# Patient Record
Sex: Male | Born: 1935
Health system: Southern US, Community
[De-identification: ages and names within clinical notes are randomized; demographics above are authoritative.]

## PROBLEM LIST (undated history)

## (undated) DIAGNOSIS — E785 Hyperlipidemia, unspecified: Secondary | ICD-10-CM

## (undated) DIAGNOSIS — K921 Melena: Secondary | ICD-10-CM

## (undated) DIAGNOSIS — G459 Transient cerebral ischemic attack, unspecified: Secondary | ICD-10-CM

## (undated) DIAGNOSIS — J302 Other seasonal allergic rhinitis: Secondary | ICD-10-CM

## (undated) DIAGNOSIS — N400 Enlarged prostate without lower urinary tract symptoms: Secondary | ICD-10-CM

## (undated) DIAGNOSIS — R32 Unspecified urinary incontinence: Secondary | ICD-10-CM

## (undated) HISTORY — DX: Benign prostatic hyperplasia without lower urinary tract symptoms: N40.0

## (undated) HISTORY — DX: Unspecified urinary incontinence: R32

## (undated) HISTORY — DX: Melena: K92.1

## (undated) HISTORY — DX: Transient cerebral ischemic attack, unspecified: G45.9

## (undated) HISTORY — DX: Hyperlipidemia, unspecified: E78.5

## (undated) HISTORY — DX: Other seasonal allergic rhinitis: J30.2

---

## 1962-08-07 HISTORY — PX: TONSILLECTOMY: SUR1361

## 2008-08-07 HISTORY — PX: BACK SURGERY: SHX140

## 2014-08-07 LAB — HM COLONOSCOPY

## 2016-08-07 HISTORY — PX: NECK SURGERY: SHX720

## 2017-08-14 ENCOUNTER — Ambulatory Visit: Payer: Medicare Other | Admitting: Primary Care

## 2017-08-14 ENCOUNTER — Encounter: Payer: Self-pay | Admitting: Primary Care

## 2017-08-14 ENCOUNTER — Other Ambulatory Visit: Payer: Self-pay | Admitting: Primary Care

## 2017-08-14 VITALS — BP 124/82 | HR 62 | Temp 97.8°F | Ht 71.5 in | Wt 195.0 lb

## 2017-08-14 DIAGNOSIS — G459 Transient cerebral ischemic attack, unspecified: Secondary | ICD-10-CM

## 2017-08-14 DIAGNOSIS — N400 Enlarged prostate without lower urinary tract symptoms: Secondary | ICD-10-CM

## 2017-08-14 DIAGNOSIS — E785 Hyperlipidemia, unspecified: Secondary | ICD-10-CM | POA: Insufficient documentation

## 2017-08-14 DIAGNOSIS — H409 Unspecified glaucoma: Secondary | ICD-10-CM | POA: Diagnosis not present

## 2017-08-14 LAB — COMPREHENSIVE METABOLIC PANEL
ALBUMIN: 4.2 g/dL (ref 3.5–5.2)
ALK PHOS: 36 U/L — AB (ref 39–117)
ALT: 20 U/L (ref 0–53)
AST: 20 U/L (ref 0–37)
BUN: 22 mg/dL (ref 6–23)
CALCIUM: 9.2 mg/dL (ref 8.4–10.5)
CO2: 30 mEq/L (ref 19–32)
CREATININE: 0.98 mg/dL (ref 0.40–1.50)
Chloride: 104 mEq/L (ref 96–112)
GFR: 78.28 mL/min (ref >60.00)
Glucose, Bld: 100 mg/dL — ABNORMAL HIGH (ref 70–99)
Potassium: 4.8 mEq/L (ref 3.5–5.1)
Sodium: 138 mEq/L (ref 135–145)
Total Bilirubin: 0.6 mg/dL (ref 0.2–1.2)
Total Protein: 6.5 g/dL (ref 6.0–8.3)

## 2017-08-14 LAB — LIPID PANEL
CHOLESTEROL: 142 mg/dL (ref 0–200)
HDL: 36.2 mg/dL — ABNORMAL LOW (ref >39.00)
LDL Cholesterol: 78 mg/dL (ref 0–99)
NonHDL: 105.3
TRIGLYCERIDES: 136 mg/dL (ref 0.0–149.0)
Total CHOL/HDL Ratio: 4
VLDL: 27.2 mg/dL (ref 0.0–40.0)

## 2017-08-14 MED ORDER — SIMVASTATIN 10 MG PO TABS: 10.0000 mg | ORAL_TABLET | Freq: Every day | ORAL | 3 refills | Status: DC

## 2017-08-14 NOTE — Assessment & Plan Note (Signed)
Diagnosed in 2000, has been on Plavix since.  Continue Plavix and lipid control.  Lipid panel pending today.

## 2017-08-14 NOTE — Assessment & Plan Note (Signed)
Repeat lipid panel pending today.  Continue simvastatin.

## 2017-08-14 NOTE — Assessment & Plan Note (Signed)
Asymptomatic with tamsulosin.  Continue same.

## 2017-08-14 NOTE — Patient Instructions (Signed)
Stop by the lab prior to leaving today. I will notify you of your results once received.   Please schedule a physical with me in six months. You may also schedule a lab only appointment 3-4 days prior. We will discuss your lab results in detail during your physical.  It was a pleasure to meet you today! Please don't hesitate to call or message me with any questions. Welcome to [facility]!

## 2017-08-14 NOTE — Progress Notes (Signed)
Subjective:    Patient ID: Julian Ellis, male    DOB: Jul 27, 1936, 83 y.o.   MRN: [id number]  HPI  Julian Ellis is a 82 year old male who presents today to establish care and discuss the problems mentioned below. Will obtain old records. His last physical was about 1 year ago.  1) Hyperlipidemia: Currently managed on Simvastatin 10 mg. His last cholesterol check was about one year ago which was normal.  He is fasting today.  2) BPH: Currently managed on tamsulosin 0.4 mg for which he's taken for years. This has helped with symptoms of nocturia and daily frequency.   3) Glaucoma: Currently managed on timolol and latanoprost drops. He is following through the [place] and thinks he'll be following with [facility].   4) TIA: Diagnosed in 2000. Symptoms of dizziness that was presumed to be central vertigo secondary to TIA. He's been on Plavix since.  He denies dizziness, numbness/tingling, weakness.  5) Chronic Neck and Back Pain: Cervical Spinal fusion in 2018. History of microdiscectomy to lumbar spine in 2010 from ruptured disc.  He feels that he has recovered well from the cervical fusion and reports significant increase of range of motion.  Review of Systems  Eyes: Negative for visual disturbance.  Respiratory: Negative for shortness of breath.   Cardiovascular: Negative for chest pain.  Musculoskeletal: Negative for back pain and neck pain.  Neurological: Negative for dizziness, weakness, numbness and headaches.       Past Medical History:  Diagnosis Date  . Allergy   . Blood in stool   . BPH (benign prostatic hyperplasia)   . Hyperlipidemia   . Stroke ([place])   . Urine incontinence      Social History   Socioeconomic History  . Marital status: Married    Spouse name: Not on file  . Number of children: Not on file  . Years of education: Not on file  . Highest education level: Not on file  Social Needs  . Financial resource strain: Not on file  . Food insecurity - worry:  Not on file  . Food insecurity - inability: Not on file  . Transportation needs - medical: Not on file  . Transportation needs - non-medical: Not on file  Occupational History  . Not on file  Tobacco Use  . Smoking status: Never Smoker  . Smokeless tobacco: Never Used  Substance and Sexual Activity  . Alcohol use: No    Frequency: Never  . Drug use: No  . Sexual activity: Yes  Other Topics Concern  . Not on file  Social History Narrative   Married.   3 children.   Moved from [place].   Worked for [employer].   Enjoys hiking, spending time outdoors.       Family History  Problem Relation Age of Onset  . Hyperlipidemia Mother   . Hypertension Mother   . Heart disease Father   . Heart attack Father   . Heart attack Sister   . Stroke Sister   . Alcohol abuse Brother   . Stroke Brother   . Early death Sister   . Heart disease Brother   . Heart attack Brother     Allergies  Allergen Reactions  . Compazine [Prochlorperazine] Anaphylaxis  . Gluten Meal   . Wheat Bran     Current Outpatient Medications on File Prior to Visit  Medication Sig Dispense Refill  . clopidogrel (PLAVIX) 75 MG tablet Take 75 mg by mouth daily.    Marland Kitchen  latanoprost (XALATAN) 0.005 % ophthalmic solution Place 1 drop into both eyes at bedtime.    . Multiple Vitamin (MULTIVITAMIN) tablet Take 1 tablet by mouth daily.    . simvastatin (ZOCOR) 10 MG tablet Take 10 mg by mouth daily.    . tamsulosin (FLOMAX) 0.4 MG CAPS capsule Take 0.4 mg by mouth daily.    . timolol (BETIMOL) 0.5 % ophthalmic solution Place 1 drop into both eyes daily.     No current facility-administered medications on file prior to visit.     BP 124/82   Pulse 62   Temp 97.8 F (36.6 C) (Oral)   Ht 5' 11.5" (1.816 m)   Wt 195 lb (88.5 kg)   SpO2 98%   BMI 26.82 kg/m    Objective:   Physical Exam  Constitutional: He is oriented to person, place, and time. He appears well-nourished.  Neck: Neck supple.    Cardiovascular: Normal rate and regular rhythm.  Pulmonary/Chest: Effort normal and breath sounds normal. He has no wheezes. He has no rales.  Neurological: He is alert and oriented to person, place, and time.  Skin: Skin is warm and dry.  Psychiatric: He has a normal mood and affect.          Assessment & Plan:

## 2017-08-14 NOTE — Assessment & Plan Note (Signed)
He will be following with the [facility] probably in [place] for maintenance.

## 2017-10-10 ENCOUNTER — Other Ambulatory Visit: Payer: Self-pay | Admitting: Primary Care

## 2017-10-10 DIAGNOSIS — N400 Enlarged prostate without lower urinary tract symptoms: Secondary | ICD-10-CM

## 2017-10-10 DIAGNOSIS — G459 Transient cerebral ischemic attack, unspecified: Secondary | ICD-10-CM

## 2017-10-10 MED ORDER — CLOPIDOGREL BISULFATE 75 MG PO TABS: 75.0000 mg | ORAL_TABLET | Freq: Every day | ORAL | 3 refills | Status: DC

## 2017-10-10 MED ORDER — TAMSULOSIN HCL 0.4 MG PO CAPS: 0.4000 mg | ORAL_CAPSULE | Freq: Every day | ORAL | 3 refills | Status: DC

## 2017-10-10 NOTE — Telephone Encounter (Signed)
Received faxed refill request   Medications have not been prescribed by Julian Ellis. Patient was first seen on 08/14/2017

## 2017-10-10 NOTE — Telephone Encounter (Signed)
Refills sent to pharmacy. 

## 2018-04-17 ENCOUNTER — Other Ambulatory Visit: Payer: Self-pay | Admitting: Primary Care

## 2018-04-17 DIAGNOSIS — E785 Hyperlipidemia, unspecified: Secondary | ICD-10-CM

## 2018-04-26 ENCOUNTER — Ambulatory Visit (INDEPENDENT_AMBULATORY_CARE_PROVIDER_SITE_OTHER): Payer: Medicare Other | Admitting: Primary Care

## 2018-04-26 ENCOUNTER — Encounter: Payer: Self-pay | Admitting: Primary Care

## 2018-04-26 VITALS — BP 120/76 | HR 59 | Temp 98.2°F | Ht 71.5 in | Wt 188.8 lb

## 2018-04-26 DIAGNOSIS — G459 Transient cerebral ischemic attack, unspecified: Secondary | ICD-10-CM | POA: Diagnosis not present

## 2018-04-26 DIAGNOSIS — N400 Enlarged prostate without lower urinary tract symptoms: Secondary | ICD-10-CM | POA: Diagnosis not present

## 2018-04-26 DIAGNOSIS — Z23 Encounter for immunization: Secondary | ICD-10-CM | POA: Diagnosis not present

## 2018-04-26 DIAGNOSIS — L989 Disorder of the skin and subcutaneous tissue, unspecified: Secondary | ICD-10-CM

## 2018-04-26 MED ORDER — CLOPIDOGREL BISULFATE 75 MG PO TABS: 75.0000 mg | ORAL_TABLET | Freq: Every day | ORAL | 1 refills | Status: DC

## 2018-04-26 MED ORDER — TAMSULOSIN HCL 0.4 MG PO CAPS
0.4000 mg | ORAL_CAPSULE | Freq: Every day | ORAL | 3 refills | Status: DC
Start: 2018-04-26 — End: 2019-01-13

## 2018-04-26 NOTE — Assessment & Plan Note (Signed)
Compliant to clopidogrel for years. He will speak with the [facility] regarding continuing this medication. Refills sent to pharmacy.

## 2018-04-26 NOTE — Progress Notes (Signed)
Subjective:    Patient ID: Julian Ellis, male    DOB: 1936-04-05, 82 y.o.   MRN: [id number]  HPI  Julian Ellis is an 82 year old male who presents today for form completion, medication refills, and a chief complaint of skin irritation.  1) Form Completion: He presents today with a form form [place]. The form is to make sure he is able to undergo a 2-3 mile hike over natural surface trail including roots, rocks, inclines/declines.   He used to lead hikes professionally and feels he's able to complete this hike without problems. His last hike was 4 miles last Friday. He denies exertional shortness of breath and chest pain.   2) Skin Spots: Located to bilateral face and cranium. The spots are located to the bilateral cheeks and parietal head that he first noticed about one month. The spots will get bright red and will then be flesh colored. He's noticed some scaling. He denies pain, itching. He is outdoors often and wears a hat. He does not use sunscreen.   3) Medication Refills: Requesting refills of tamsulosin 0.4 mg and clopidogrel 75 mg. He is doing well on both medications for which he takes daily. He would like to consider coming off of clopidogrel as he's been taking for over 10 years. He will discuss this during his visit at the [facility] in October 2019.  Review of Systems  Constitutional: Negative for fatigue.  Respiratory: Negative for shortness of breath.   Cardiovascular: Negative for chest pain.  Skin:       Skin lesions  Neurological: Negative for dizziness and headaches.       Past Medical History:  Diagnosis Date  . Blood in stool   . BPH (benign prostatic hyperplasia)   . Hyperlipidemia   . Seasonal allergies   . TIA (transient ischemic attack)   . Urine incontinence      Social History   Socioeconomic History  . Marital status: Married    Spouse name: Not on file  . Number of children: Not on file  . Years of education: Not on file  .  Highest education level: Not on file  Occupational History  . Not on file  Social Needs  . Financial resource strain: Not on file  . Food insecurity:    Worry: Not on file    Inability: Not on file  . Transportation needs:    Medical: Not on file    Non-medical: Not on file  Tobacco Use  . Smoking status: Never Smoker  . Smokeless tobacco: Never Used  Substance and Sexual Activity  . Alcohol use: No    Frequency: Never  . Drug use: No  . Sexual activity: Yes  Lifestyle  . Physical activity:    Days per week: Not on file    Minutes per session: Not on file  . Stress: Not on file  Relationships  . Social connections:    Talks on phone: Not on file    Gets together: Not on file    Attends religious service: Not on file    Active member of club or organization: Not on file    Attends meetings of clubs or organizations: Not on file    Relationship status: Not on file  . Intimate partner violence:    Fear of current or ex partner: Not on file    Emotionally abused: Not on file    Physically abused: Not on file    Forced  sexual activity: Not on file  Other Topics Concern  . Not on file  Social History Narrative   Married.   3 children.   Moved from [place].   Worked for [employer].   Enjoys hiking, spending time outdoors.      Family History  Problem Relation Age of Onset  . Hyperlipidemia Mother   . Hypertension Mother   . Heart disease Father   . Heart attack Father   . Heart attack Sister   . Stroke Sister   . Alcohol abuse Brother   . Stroke Brother   . Early death Sister   . Heart disease Brother   . Heart attack Brother     Allergies  Allergen Reactions  . Compazine [Prochlorperazine] Anaphylaxis  . Gluten Meal   . Wheat Bran     Current Outpatient Medications on File Prior to Visit  Medication Sig Dispense Refill  . latanoprost (XALATAN) 0.005 % ophthalmic solution Place 1 drop into both eyes at bedtime.    . Multiple Vitamin (MULTIVITAMIN)  tablet Take 1 tablet by mouth daily.    . simvastatin (ZOCOR) 10 MG tablet Take 1 tablet (10 mg total) by mouth daily. Due for a physical/follow up in [place] 90 tablet 1  . timolol (BETIMOL) 0.5 % ophthalmic solution Place 1 drop into both eyes daily.     No current facility-administered medications on file prior to visit.     BP 120/76   Pulse (!) 59   Temp 98.2 F (36.8 C) (Oral)   Ht 5' 11.5" (1.816 m)   Wt 188 lb 12 oz (85.6 kg)   SpO2 97%   BMI 25.96 kg/m    Objective:   Physical Exam  Constitutional: He appears well-nourished.  Neck: Neck supple.  Cardiovascular: Normal rate and regular rhythm.  Respiratory: Effort normal and breath sounds normal.  Skin: Skin is warm and dry.  0.25 cm oval shaped mildly erythematous lesion to left cheek. Scaly grey lesion to right cheek. Numerous dark brown moles to cranium. No open wounds  Psychiatric: He has a normal mood and affect.           Assessment & Plan:  Skin Lesions:  Located to face and cranium. The lesion to the left cheek does appear suspicious for precancerous lesion. Referral placed to dermatology for further evaluation.  Form Completion:  Needing approval to participate in 2-3 mile hikes. Given that he completes 4 mile hikes regularly, with his last hike being last week, I don't see why he can't participate.  Exam unremarkable. Form completed.  Julian Koch, NP

## 2018-04-26 NOTE — Addendum Note (Signed)
Addended by: Jacqualin Combes on: 04/26/2018 09:21 AM   Modules accepted: Orders

## 2018-04-26 NOTE — Patient Instructions (Signed)
You will be contacted regarding your referral to dermatology.  Please let us know if you have not been contacted within one week.   I sent refills of your medications to your pharmacy.  Follow up with the [facility] as scheduled.  It was a pleasure to see you today!

## 2018-04-26 NOTE — Assessment & Plan Note (Signed)
Compliant to tamsulosin and feels well managed. Refills sent to pharmacy.

## 2018-07-17 DIAGNOSIS — L570 Actinic keratosis: Secondary | ICD-10-CM | POA: Diagnosis not present

## 2018-07-17 DIAGNOSIS — Z1283 Encounter for screening for malignant neoplasm of skin: Secondary | ICD-10-CM | POA: Diagnosis not present

## 2018-07-17 DIAGNOSIS — L219 Seborrheic dermatitis, unspecified: Secondary | ICD-10-CM | POA: Diagnosis not present

## 2018-07-17 DIAGNOSIS — D485 Neoplasm of uncertain behavior of skin: Secondary | ICD-10-CM | POA: Diagnosis not present

## 2018-07-17 DIAGNOSIS — L821 Other seborrheic keratosis: Secondary | ICD-10-CM | POA: Diagnosis not present

## 2018-07-25 ENCOUNTER — Encounter: Payer: Self-pay | Admitting: Primary Care

## 2018-07-25 ENCOUNTER — Ambulatory Visit (INDEPENDENT_AMBULATORY_CARE_PROVIDER_SITE_OTHER): Payer: Medicare Other | Admitting: Primary Care

## 2018-07-25 VITALS — BP 122/78 | HR 57 | Temp 98.0°F | Ht 71.5 in | Wt 192.0 lb

## 2018-07-25 DIAGNOSIS — J069 Acute upper respiratory infection, unspecified: Secondary | ICD-10-CM

## 2018-07-25 MED ORDER — AMOXICILLIN 875 MG PO TABS: 875.0000 mg | ORAL_TABLET | Freq: Two times a day (BID) | ORAL | 0 refills | Status: DC

## 2018-07-25 NOTE — Patient Instructions (Signed)
Start amoxicillin antibiotics for the infection. Take 1 tablet by mouth twice daily for 7 days.  Continue the cough suppressant as needed. Make sure to drink plenty of water with this.  It was a pleasure to see you today!

## 2018-07-25 NOTE — Progress Notes (Signed)
Subjective:    Patient ID: Julian Ellis, male    DOB: [birth year], 82 y.o.   MRN: [id number]  HPI  Mr. Inclan is an 82 year old male who presents today with a chief complaint of cough.   He also reports nasal congestion, ear fullness, headaches, chest congestion. His cough is productive with yellowish sputum. His symptoms began one week ago, feels worse over the last 24 hours. He denies fevers. He's tried taking OTC Mucinex DM without improvement.   Review of Systems  Constitutional: Positive for fatigue. Negative for fever.  HENT: Positive for congestion, postnasal drip and sore throat.        Ear fullness  Respiratory: Positive for cough and wheezing.        Past Medical History:  Diagnosis Date  . Blood in stool   . BPH (benign prostatic hyperplasia)   . Hyperlipidemia   . Seasonal allergies   . TIA (transient ischemic attack)   . Urine incontinence      Social History   Socioeconomic History  . Marital status: Married    Spouse name: Not on file  . Number of children: Not on file  . Years of education: Not on file  . Highest education level: Not on file  Occupational History  . Not on file  Social Needs  . Financial resource strain: Not on file  . Food insecurity:    Worry: Not on file    Inability: Not on file  . Transportation needs:    Medical: Not on file    Non-medical: Not on file  Tobacco Use  . Smoking status: Never Smoker  . Smokeless tobacco: Never Used  Substance and Sexual Activity  . Alcohol use: No    Frequency: Never  . Drug use: No  . Sexual activity: Yes  Lifestyle  . Physical activity:    Days per week: Not on file    Minutes per session: Not on file  . Stress: Not on file  Relationships  . Social connections:    Talks on phone: Not on file    Gets together: Not on file    Attends religious service: Not on file    Active member of club or organization: Not on file    Attends meetings of clubs or organizations: Not on file    Relationship status: Not on file  . Intimate partner violence:    Fear of current or ex partner: Not on file    Emotionally abused: Not on file    Physically abused: Not on file    Forced sexual activity: Not on file  Other Topics Concern  . Not on file  Social History Narrative   Married.   3 children.   Moved from [place].   Worked for [employer].   Enjoys hiking, spending time outdoors.      Family History  Problem Relation Age of Onset  . Hyperlipidemia Mother   . Hypertension Mother   . Heart disease Father   . Heart attack Father   . Heart attack Sister   . Stroke Sister   . Alcohol abuse Brother   . Stroke Brother   . Early death Sister   . Heart disease Brother   . Heart attack Brother     Allergies  Allergen Reactions  . Compazine [Prochlorperazine] Anaphylaxis  . Gluten Meal   . Wheat Bran     Current Outpatient Medications on File Prior to Visit  Medication Sig Dispense Refill  .  clopidogrel (PLAVIX) 75 MG tablet Take 1 tablet (75 mg total) by mouth daily. 90 tablet 1  . latanoprost (XALATAN) 0.005 % ophthalmic solution Place 1 drop into both eyes at bedtime.    . Multiple Vitamin (MULTIVITAMIN) tablet Take 1 tablet by mouth daily.    . simvastatin (ZOCOR) 10 MG tablet Take 1 tablet (10 mg total) by mouth daily. Due for a physical/follow up in [place] 90 tablet 1  . tamsulosin (FLOMAX) 0.4 MG CAPS capsule Take 1 capsule (0.4 mg total) by mouth daily. 90 capsule 3  . timolol (BETIMOL) 0.5 % ophthalmic solution Place 1 drop into both eyes daily.     No current facility-administered medications on file prior to visit.     BP 122/78   Pulse (!) 57   Temp 98 F (36.7 C) (Oral)   Ht 5' 11.5" (1.816 m)   Wt 192 lb (87.1 kg)   SpO2 98%   BMI 26.41 kg/m    Objective:   Physical Exam  Constitutional: He appears well-nourished. He does not appear ill.  HENT:  Right Ear: Tympanic membrane and ear canal normal.  Left Ear: Tympanic membrane and ear  canal normal.  Nose: No mucosal edema. Right sinus exhibits no maxillary sinus tenderness and no frontal sinus tenderness. Left sinus exhibits no maxillary sinus tenderness and no frontal sinus tenderness.  Mouth/Throat: Oropharynx is clear and moist.  Neck: Neck supple.  Cardiovascular: Normal rate and regular rhythm.  Respiratory: Effort normal and breath sounds normal. He has no wheezes.  Course breath sounds to bilateral upper fields. Persistent dry cough during exam  Skin: Skin is warm and dry.           Assessment & Plan:  URI vs Bronchitis:  Cough, congestion, fatigue x 1 week, now feeling worse. Exam today suspicious for early bacterial cause, he does appear stable. Given duration of symptoms coupled with exam, will treat.  Rx for Amoxil course sent to pharmacy. Continue OTC cough suppressant. Follow up PRN.  [name], NP

## 2018-08-05 DIAGNOSIS — J209 Acute bronchitis, unspecified: Secondary | ICD-10-CM | POA: Diagnosis not present

## 2018-08-05 DIAGNOSIS — R0602 Shortness of breath: Secondary | ICD-10-CM | POA: Diagnosis not present

## 2018-08-05 DIAGNOSIS — Z8673 Personal history of transient ischemic attack (TIA), and cerebral infarction without residual deficits: Secondary | ICD-10-CM | POA: Diagnosis not present

## 2018-08-05 DIAGNOSIS — B9689 Other specified bacterial agents as the cause of diseases classified elsewhere: Secondary | ICD-10-CM | POA: Diagnosis not present

## 2018-08-05 DIAGNOSIS — J019 Acute sinusitis, unspecified: Secondary | ICD-10-CM | POA: Diagnosis not present

## 2018-08-15 DIAGNOSIS — J209 Acute bronchitis, unspecified: Secondary | ICD-10-CM | POA: Diagnosis not present

## 2018-08-15 DIAGNOSIS — R062 Wheezing: Secondary | ICD-10-CM | POA: Diagnosis not present

## 2018-08-15 DIAGNOSIS — R05 Cough: Secondary | ICD-10-CM | POA: Diagnosis not present

## 2018-10-02 ENCOUNTER — Other Ambulatory Visit: Payer: Self-pay | Admitting: Primary Care

## 2018-10-02 DIAGNOSIS — E785 Hyperlipidemia, unspecified: Secondary | ICD-10-CM

## 2018-10-11 DIAGNOSIS — K219 Gastro-esophageal reflux disease without esophagitis: Secondary | ICD-10-CM | POA: Diagnosis not present

## 2018-10-11 DIAGNOSIS — R05 Cough: Secondary | ICD-10-CM | POA: Diagnosis not present

## 2018-10-25 ENCOUNTER — Other Ambulatory Visit: Payer: Self-pay

## 2018-10-25 ENCOUNTER — Ambulatory Visit (INDEPENDENT_AMBULATORY_CARE_PROVIDER_SITE_OTHER): Payer: Medicare Other | Admitting: Primary Care

## 2018-10-25 ENCOUNTER — Encounter: Payer: Self-pay | Admitting: Primary Care

## 2018-10-25 DIAGNOSIS — R05 Cough: Secondary | ICD-10-CM | POA: Diagnosis not present

## 2018-10-25 DIAGNOSIS — R053 Chronic cough: Secondary | ICD-10-CM | POA: Insufficient documentation

## 2018-10-25 LAB — CBC WITH DIFFERENTIAL/PLATELET
ABSOLUTE MONOCYTES: 769 {cells}/uL (ref 200–950)
BASOS PCT: 0.8 %
Basophils Absolute: 50 cells/uL (ref 0–200)
Eosinophils Absolute: 221 cells/uL (ref 15–500)
Eosinophils Relative: 3.5 %
HEMATOCRIT: 41.4 % (ref 38.5–50.0)
Hemoglobin: 14.2 g/dL (ref 13.2–17.1)
Lymphs Abs: 1821 cells/uL (ref 850–3900)
MCH: 31.6 pg (ref 27.0–33.0)
MCHC: 34.3 g/dL (ref 32.0–36.0)
MCV: 92 fL (ref 80.0–100.0)
MPV: 12.1 fL (ref 7.5–12.5)
Monocytes Relative: 12.2 %
Neutro Abs: 3440 cells/uL (ref 1500–7800)
Neutrophils Relative %: 54.6 %
Platelets: 245 10*3/uL (ref 140–400)
RBC: 4.5 10*6/uL (ref 4.20–5.80)
RDW: 13.4 % (ref 11.0–15.0)
Total Lymphocyte: 28.9 %
WBC: 6.3 10*3/uL (ref 3.8–10.8)

## 2018-10-25 MED ORDER — LEVOCETIRIZINE DIHYDROCHLORIDE 5 MG PO TABS
5.0000 mg | ORAL_TABLET | Freq: Every evening | ORAL | 0 refills | Status: DC
Start: 2018-10-25 — End: 2019-01-01

## 2018-10-25 MED ORDER — OMEPRAZOLE 20 MG PO CPDR: 20.0000 mg | DELAYED_RELEASE_CAPSULE | Freq: Every day | ORAL | 0 refills | Status: DC

## 2018-10-25 NOTE — Progress Notes (Signed)
Subjective:    Patient ID: Julian Ellis, male    DOB: 1936-06-05, 83 y.o.   MRN: [id number]  HPI  Mr. Landgrebe is an 83 year old male who presents today with a chief complaint of cough.  His cough began in early-mid December, was evaluated on 07/25/18. His symptoms were suspicious for early bronchitis vs URI so he was treated with Amoxil.   He was evaluated again on 08/05/18 and 08/15/18 with symptoms of continued cough. He was treated with a nebulized breathing treatment, prednisone, albuterol inhaler, and azithromycin without improvement. He underwent CBC with diff in January 2020 which was unremarkable.   Since then he has been evaluated by ENT on 10/11/18 for ongoing chronic cough. He described his cough as a "tickle" in the throat without productive cough, nasal congestion, GERD symptoms. He endorsed not feeling better from previously prescribed antibiotics. His exam per ENT was consistent for reflux induced cough so it was recommended he avoid caffeine, chocolate, start daily H2 blocker and return one month later for continued symptoms.  Since his ENT visit he's stopped caffeine, chocolate. He's been taking the Tagamet as recommended and has had no improvement. He continues to feel the tickle to his throat. He does notice post nasal drip, sneezing. He has not taken any antihistamines. He has never smoked, no history of asthma. His cough was mostly during the day, recently just started coughing at night. He denies fevers, chills, esophageal reflux/burning.   Review of Systems  Constitutional: Negative for chills, fatigue and fever.  HENT: Negative for congestion, ear pain and sinus pressure.   Respiratory: Positive for cough. Negative for shortness of breath and wheezing.   Cardiovascular: Negative for chest pain.  Gastrointestinal:       Denies GERD symptoms       Past Medical History:  Diagnosis Date  . Blood in stool   . BPH (benign prostatic hyperplasia)   . Hyperlipidemia    . Seasonal allergies   . TIA (transient ischemic attack)   . Urine incontinence      Social History   Socioeconomic History  . Marital status: Married    Spouse name: Not on file  . Number of children: Not on file  . Years of education: Not on file  . Highest education level: Not on file  Occupational History  . Not on file  Social Needs  . Financial resource strain: Not on file  . Food insecurity:    Worry: Not on file    Inability: Not on file  . Transportation needs:    Medical: Not on file    Non-medical: Not on file  Tobacco Use  . Smoking status: Never Smoker  . Smokeless tobacco: Never Used  Substance and Sexual Activity  . Alcohol use: No    Frequency: Never  . Drug use: No  . Sexual activity: Yes  Lifestyle  . Physical activity:    Days per week: Not on file    Minutes per session: Not on file  . Stress: Not on file  Relationships  . Social connections:    Talks on phone: Not on file    Gets together: Not on file    Attends religious service: Not on file    Active member of club or organization: Not on file    Attends meetings of clubs or organizations: Not on file    Relationship status: Not on file  . Intimate partner violence:    Fear of current or  ex partner: Not on file    Emotionally abused: Not on file    Physically abused: Not on file    Forced sexual activity: Not on file  Other Topics Concern  . Not on file  Social History Narrative   Married.   3 children.   Moved from [place].   Worked for [employer].   Enjoys hiking, spending time outdoors.      Family History  Problem Relation Age of Onset  . Hyperlipidemia Mother   . Hypertension Mother   . Heart disease Father   . Heart attack Father   . Heart attack Sister   . Stroke Sister   . Alcohol abuse Brother   . Stroke Brother   . Early death Sister   . Heart disease Brother   . Heart attack Brother     Allergies  Allergen Reactions  . Compazine [Prochlorperazine]  Anaphylaxis  . Gluten Meal   . Wheat Bran     Current Outpatient Medications on File Prior to Visit  Medication Sig Dispense Refill  . clopidogrel (PLAVIX) 75 MG tablet Take 1 tablet (75 mg total) by mouth daily. 90 tablet 1  . latanoprost (XALATAN) 0.005 % ophthalmic solution Place 1 drop into both eyes at bedtime.    . Multiple Vitamin (MULTIVITAMIN) tablet Take 1 tablet by mouth daily.    . simvastatin (ZOCOR) 10 MG tablet Take 1 tablet (10 mg total) by mouth daily. NEED FOLLOW UP APPOINTMENT FOR ANY MORE REFILLS 90 tablet 0  . tamsulosin (FLOMAX) 0.4 MG CAPS capsule Take 1 capsule (0.4 mg total) by mouth daily. 90 capsule 3  . timolol (BETIMOL) 0.5 % ophthalmic solution Place 1 drop into both eyes daily.     No current facility-administered medications on file prior to visit.     BP 120/84 (BP Location: Left Arm, Patient Position: Sitting, Cuff Size: Normal)   Pulse 73   Temp 98.1 F (36.7 C) (Oral)   Wt 189 lb (85.7 kg)   SpO2 95%   BMI 25.99 kg/m    Objective:   Physical Exam  Constitutional: He appears well-nourished. He does not appear ill.  HENT:  Right Ear: Tympanic membrane and ear canal normal.  Left Ear: Tympanic membrane and ear canal normal.  Nose: No mucosal edema. Right sinus exhibits no maxillary sinus tenderness and no frontal sinus tenderness. Left sinus exhibits no maxillary sinus tenderness and no frontal sinus tenderness.  Mouth/Throat: Oropharynx is clear and moist.  Clearing throat during exam several times  Neck: Neck supple.  Cardiovascular: Normal rate and regular rhythm.  Respiratory: Effort normal and breath sounds normal. He has no wheezes.  Skin: Skin is warm and dry.           Assessment & Plan:

## 2018-10-25 NOTE — Assessment & Plan Note (Addendum)
Chronic since early December 2019, no improvement with antibiotics, prednisone, albuterol, OTC treatments, H2 blocker.  Exam today without evidence of bacterial/viral cause. He has never smoked and has no history of asthma. Suspect more allergy involvement mixed with GERD. Not on ACE/ARB.   Check CBC for completeness.  Stop Tagamet. Start omeprazole 20 mg and Xyzal. Also discussed to use non menthol cough drops. He will call in 1-2 weeks if no improvement. Consider spirometry vs PFT's, pulmonology referral.

## 2018-10-25 NOTE — Patient Instructions (Signed)
Stop by the lab prior to leaving today. I will notify you of your results once received.   Start omeprazole 20 mg daily for chronic cough. Start levocetirizine (Xyzal) daily for chronic cough.  You can also try non menthol cough drops for cough in the interim.   Please call me in 1-2 weeks if no improvement.   It was a pleasure to see you today!

## 2018-12-18 ENCOUNTER — Other Ambulatory Visit: Payer: Self-pay | Admitting: Primary Care

## 2018-12-18 DIAGNOSIS — E785 Hyperlipidemia, unspecified: Secondary | ICD-10-CM

## 2018-12-18 DIAGNOSIS — G459 Transient cerebral ischemic attack, unspecified: Secondary | ICD-10-CM

## 2018-12-23 ENCOUNTER — Telehealth: Payer: Self-pay

## 2018-12-23 NOTE — Telephone Encounter (Addendum)
Spoken and notified patient of Julian Ellis comments. Patient verbalized understanding. Schedule a virtual appointment on 12/24/2018  Anda Kraft, would sign your note. I could not sign the encounter.

## 2018-12-23 NOTE — Telephone Encounter (Signed)
Please thank patient for the update.I'd like to complete a virtual visit with him, will you please set this up? I have several follow up questions.

## 2018-12-23 NOTE — Telephone Encounter (Signed)
Pt left v/m; in past 5 - 6 months pt has been seen x 3. Pt cough continues; at last 10/25/18 visit was noted if no improvement consider spirometry or pulmonary referral. In v/m pt wanting to know if needs covid testing due to persistent cough or what to do. Pt request cb.

## 2018-12-24 ENCOUNTER — Ambulatory Visit (INDEPENDENT_AMBULATORY_CARE_PROVIDER_SITE_OTHER)
Admission: RE | Admit: 2018-12-24 | Discharge: 2018-12-24 | Disposition: A | Discharge status: Home / Self Care | Payer: Medicare Other | Source: Ambulatory Visit | Attending: Primary Care | Admitting: Primary Care

## 2018-12-24 ENCOUNTER — Other Ambulatory Visit: Payer: Self-pay

## 2018-12-24 ENCOUNTER — Ambulatory Visit (INDEPENDENT_AMBULATORY_CARE_PROVIDER_SITE_OTHER): Payer: Medicare Other | Admitting: Primary Care

## 2018-12-24 ENCOUNTER — Telehealth: Payer: Self-pay | Admitting: Primary Care

## 2018-12-24 DIAGNOSIS — R05 Cough: Secondary | ICD-10-CM

## 2018-12-24 DIAGNOSIS — R053 Chronic cough: Secondary | ICD-10-CM

## 2018-12-24 NOTE — Telephone Encounter (Signed)
Patient came by for a chest x-ray and said he's suppose to be referred to Pulmonary.  Patient prefers [facility] and patient can go anytime.

## 2018-12-24 NOTE — Assessment & Plan Note (Addendum)
Continued despite numerous treatment regimens. He does not appear sickly.   Check chest xray. Referral placed to pulmonology for further evaluation.  Continue omeprazole, add in Claritin which is non drowsy.

## 2018-12-24 NOTE — Progress Notes (Signed)
Subjective:    Patient ID: Julian Ellis, male    DOB: [birth year], 83 y.o.   MRN: [id number]  HPI  Virtual Visit via Video Note  I connected with Julian Ellis on 12/24/18 at 11:40 AM EDT by a video enabled telemedicine application and verified that I am speaking with the correct person using two identifiers.  Location: Patient: Home Provider: Office   I discussed the limitations of evaluation and management by telemedicine and the availability of in person appointments. The patient expressed understanding and agreed to proceed.  History of Present Illness:  [name] is a 83 year old male with a history of TIA, BPH, chronic cough, glaucoma who presents today to discuss chronic cough.  He was last evaluated for this on 10/25/18, endorsed a chronic cough since December 2019. He has been treated with Amoxil, neblized breathing treatments, prednisone, albuterol, azithromycin, Tagamet without improvement. During his last visit in March his symptoms weren't suspicious for infection. He is a non smoker and was not managed on ACE/ARB so this was ruled out. He has no prior history of asthma. He was exposed to irritative chemicals during his working years, always wore protective equipment, last exposure was nearly 30 years ago.   Given this information he was treated by me with omeprazole 20 mg and Xyzal. He was instructed to call back in 1-2 weeks with an update. If no improvement then consideration would be made for PFT's and pulmonology referral.   Since his last visit he continues to cough, he doesn't feel as though he can clear his throat. His cough is non productive. He denies fevers, shortness of breath, unexplained weight loss, hemoptysis. He is still taking omeprazole 20 mg daily. He took the Xyzal for one week and stopped taking as it caused him to feel drowsy the following day. He didn't notice improvement when taking both omeprazole and Xyzal, doesn't feel improvement with  omeprazole.    Observations/Objective:  Alert and oriented. Appears well, not sickly. No distress. Speaking in complete sentences. Dry cough infrequently during exam.  Assessment and Plan:  See problem based charting.  Follow Up Instructions:  Stop by for the chest xray when possible.  You will be contacted regarding your referral to the pulmonologist.  Please let Julian Ellis know if you have not been contacted within one week.   Try some Claritin or Allegra with the omeprazole for now.   It was a pleasure to see you today! Julian Bossier, NP-C    I discussed the assessment and treatment plan with the patient. The patient was provided an opportunity to ask questions and all were answered. The patient agreed with the plan and demonstrated an understanding of the instructions.   The patient was advised to call back or seek an in-person evaluation if the symptoms worsen or if the condition fails to improve as anticipated.     [name], NP    Review of Systems  Constitutional: Negative for chills and fever.  HENT: Negative for congestion, postnasal drip and sore throat.   Respiratory: Positive for cough. Negative for shortness of breath.   Cardiovascular: Negative for chest pain.       Past Medical History:  Diagnosis Date  . Blood in stool   . BPH (benign prostatic hyperplasia)   . Hyperlipidemia   . Seasonal allergies   . TIA (transient ischemic attack)   . Urine incontinence      Social History   Socioeconomic History  .  Marital status: Married    Spouse name: Not on file  . Number of children: Not on file  . Years of education: Not on file  . Highest education level: Not on file  Occupational History  . Not on file  Social Needs  . Financial resource strain: Not on file  . Food insecurity:    Worry: Not on file    Inability: Not on file  . Transportation needs:    Medical: Not on file    Non-medical: Not on file  Tobacco Use  . Smoking status:  Never Smoker  . Smokeless tobacco: Never Used  Substance and Sexual Activity  . Alcohol use: No    Frequency: Never  . Drug use: No  . Sexual activity: Yes  Lifestyle  . Physical activity:    Days per week: Not on file    Minutes per session: Not on file  . Stress: Not on file  Relationships  . Social connections:    Talks on phone: Not on file    Gets together: Not on file    Attends religious service: Not on file    Active member of club or organization: Not on file    Attends meetings of clubs or organizations: Not on file    Relationship status: Not on file  . Intimate partner violence:    Fear of current or ex partner: Not on file    Emotionally abused: Not on file    Physically abused: Not on file    Forced sexual activity: Not on file  Other Topics Concern  . Not on file  Social History Narrative   Married.   3 children.   Moved from [place].   Worked for [employer].   Enjoys hiking, spending time outdoors.     Past Surgical History:  Procedure Laterality Date  . BACK SURGERY  2010  . NECK SURGERY  2018  . TONSILLECTOMY  1964    Family History  Problem Relation Age of Onset  . Hyperlipidemia Mother   . Hypertension Mother   . Heart disease Father   . Heart attack Father   . Heart attack Sister   . Stroke Sister   . Alcohol abuse Brother   . Stroke Brother   . Early death Sister   . Heart disease Brother   . Heart attack Brother     Allergies  Allergen Reactions  . Compazine [Prochlorperazine] Anaphylaxis  . Gluten Meal     Other reaction(s): Other (See Comments)  . Sulfamethoxazole     Other reaction(s): Other (See Comments) unknown  . Wheat Bran     Other reaction(s): Other (See Comments)    Current Outpatient Medications on File Prior to Visit  Medication Sig Dispense Refill  . clopidogrel (PLAVIX) 75 MG tablet TAKE 1 TABLET BY MOUTH  DAILY 90 tablet 1  . latanoprost (XALATAN) 0.005 % ophthalmic solution Place 1 drop into both eyes at  bedtime.    Marland Kitchen levocetirizine (XYZAL) 5 MG tablet Take 1 tablet (5 mg total) by mouth every evening. For allergies/cough. 90 tablet 0  . Multiple Vitamin (MULTIVITAMIN) tablet Take 1 tablet by mouth daily.    Marland Kitchen omeprazole (PRILOSEC) 20 MG capsule Take 1 capsule (20 mg total) by mouth daily. For heartburn/cough. 90 capsule 0  . simvastatin (ZOCOR) 10 MG tablet TAKE 1 TABLET BY MOUTH  DAILY 90 tablet 0  . tamsulosin (FLOMAX) 0.4 MG CAPS capsule Take 1 capsule (0.4 mg total) by mouth daily.  90 capsule 3  . timolol (BETIMOL) 0.5 % ophthalmic solution Place 1 drop into both eyes daily.     No current facility-administered medications on file prior to visit.     There were no vitals taken for this visit.   Objective:   Physical Exam  Constitutional: He is oriented to person, place, and time. He appears well-nourished. He does not have a sickly appearance. He does not appear ill.  Respiratory: Effort normal. No respiratory distress.  Dry cough infrequently   Neurological: He is alert and oriented to person, place, and time.  Skin: Skin is dry.           Assessment & Plan:

## 2018-12-24 NOTE — Patient Instructions (Signed)
Stop by for the chest xray when possible.  You will be contacted regarding your referral to the pulmonologist.  Please let us know if you have not been contacted within one week.   Try some Claritin or Allegra with the omeprazole for now.   It was a pleasure to see you today! [name], NP-C

## 2018-12-25 NOTE — Telephone Encounter (Signed)
Pulm Appt made and patient is aware.

## 2018-12-31 ENCOUNTER — Telehealth: Payer: Self-pay | Admitting: Pulmonary Disease

## 2018-12-31 NOTE — Telephone Encounter (Signed)
Pt is aware. Nothing further needed 

## 2018-12-31 NOTE — Telephone Encounter (Signed)
Okay to proceed with visit per Dr. Lora Havens verbally.

## 2018-12-31 NOTE — Telephone Encounter (Signed)
Called patient for COVID-19 pre-screening for in office visit.  Have you recently traveled any where out of the local area in the last 2 weeks?NO  Have you been in close contact with a person diagnosed with COVID-19 within the last 2 weeks? NO  Do you currently have any of the following symptoms? If so, when did they start? Cough (YES- 5 months)  Diarrhea  Joint Pain Fever      Muscle Pain  Red eyes Shortness of breath   Abdominal pain Vomiting Loss of smell    Rash   Sore Throat Headache    Weakness Bruising or bleeding  Congestion (Phlegm production) Chest pressure when coughing  Runny Nose

## 2019-01-01 ENCOUNTER — Encounter: Payer: Self-pay | Admitting: Pulmonary Disease

## 2019-01-01 ENCOUNTER — Ambulatory Visit (INDEPENDENT_AMBULATORY_CARE_PROVIDER_SITE_OTHER): Payer: Medicare Other | Admitting: Pulmonary Disease

## 2019-01-01 ENCOUNTER — Other Ambulatory Visit: Payer: Self-pay

## 2019-01-01 VITALS — BP 122/76 | HR 70 | Temp 98.1°F | Ht 72.0 in | Wt 192.6 lb

## 2019-01-01 DIAGNOSIS — R05 Cough: Secondary | ICD-10-CM

## 2019-01-01 DIAGNOSIS — R0989 Other specified symptoms and signs involving the circulatory and respiratory systems: Secondary | ICD-10-CM

## 2019-01-01 DIAGNOSIS — J310 Chronic rhinitis: Secondary | ICD-10-CM

## 2019-01-01 DIAGNOSIS — R053 Chronic cough: Secondary | ICD-10-CM

## 2019-01-01 MED ORDER — FLUTICASONE PROPIONATE HFA 110 MCG/ACT IN AERO: 2.0000 | INHALATION_SPRAY | Freq: Two times a day (BID) | RESPIRATORY_TRACT | 5 refills | Status: DC

## 2019-01-01 MED ORDER — BENZONATATE 200 MG PO CAPS: 200.0000 mg | ORAL_CAPSULE | Freq: Three times a day (TID) | ORAL | 5 refills | Status: DC | PRN

## 2019-01-01 MED ORDER — DEXTROMETHORPHAN HBR 15 MG/5ML PO SYRP: 5.0000 mL | ORAL_SOLUTION | Freq: Three times a day (TID) | ORAL | 5 refills | Status: DC

## 2019-01-01 NOTE — Patient Instructions (Addendum)
Change omeprazole to 40 mg (2 pills) daily.  Take at dinnertime or between dinner and bedtime Continue Flonase (or equivalent) nasal inhaler New prescription: Flovent 110 mcg, 2 actuations (sprays) twice a day.  Rinse mouth thoroughly after use  Aggressive cough suppression as follows: Tessalon Perles 200 mg, take 3 times a day for first 5 days.  Then, up to every 8 hours as needed for cough Delsym (dextromethorphan) 15 mg (5 cc, 1 teaspoon) 3 times a day for 5 days.  Then up to every 8 hours as needed for cough   Follow-up in 4 to 6 weeks.  Call sooner if needed

## 2019-01-03 ENCOUNTER — Telehealth: Payer: Self-pay

## 2019-01-03 MED ORDER — FLUTICASONE PROPIONATE HFA 110 MCG/ACT IN AERO: 2.0000 | INHALATION_SPRAY | Freq: Two times a day (BID) | RESPIRATORY_TRACT | 2 refills | Status: DC

## 2019-01-03 NOTE — Telephone Encounter (Signed)
Patient called, stated that if we send it to [facility] one month at a time instead of 3 months, he will only have to pay $47 each time, which is more manageable for him. Flovent HFA [facility].

## 2019-01-03 NOTE — Progress Notes (Signed)
PULMONARY CONSULT NOTE  Requesting MD/Service: Alma Friendly, NP Date of initial consultation: 01/01/19 Reason for consultation: chronic cough  PT PROFILE: 83 y.o. male never smoker referred for evaluation of chronic cough of 5 months duration  DATA:  INTERVAL:  HPI:  As above.  His onset of cough was in December 2019.  He does not really remember details of any illness around that time.  His cough has persisted relentlessly since that time.  Earlier, it was mostly paroxysms of cough.  With that cough he would bring up scant "phlegm".  He also noted frequent sneezing.  More recently, the cough manifests as constant throat clearing with a sensation of phlegm in his throat.  He denies fever, shortness of breath, chest pain, orthopnea, paroxysmal nocturnal dyspnea, lower extremity edema, calf tenderness.  His primary care physician prescribed omeprazole 20 mg daily approximately 6 weeks ago.  This is not led to any improvement.  He has been on Flonase for approximately 2 months without significant improvement in his cough.  He was prescribed an unknown metered-dose inhaler which he used for approximately 2 months without benefit.  He has been off of this for 1 month now.  His cough is exacerbated by drinking something cold or breathing cold air.  It is improved by drinking something warm.  He can describe no other exposures that exacerbate his cough.  He is retired.  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  However, he has been retired for many years now.  He has no pets in the home.  He has no unusual hobbies or other environmental exposures.  He has no recent travel history.  He was in the [employer] in the late 50s and was stationed in [place].  He has been tested for tuberculosis in the past which was negative.  Past Medical History:  Diagnosis Date  . Blood in stool   . BPH (benign prostatic hyperplasia)   . Hyperlipidemia   .  Seasonal allergies   . TIA (transient ischemic attack)   . Urine incontinence     Past Surgical History:  Procedure Laterality Date  . BACK SURGERY  2010  . NECK SURGERY  2018  . TONSILLECTOMY  1964    MEDICATIONS: I have reviewed all medications and confirmed regimen as documented  Social History   Socioeconomic History  . Marital status: Married    Spouse name: Not on file  . Number of children: Not on file  . Years of education: Not on file  . Highest education level: Not on file  Occupational History  . Not on file  Social Needs  . Financial resource strain: Not on file  . Food insecurity:    Worry: Not on file    Inability: Not on file  . Transportation needs:    Medical: Not on file    Non-medical: Not on file  Tobacco Use  . Smoking status: Never Smoker  . Smokeless tobacco: Never Used  Substance and Sexual Activity  . Alcohol use: No    Frequency: Never  . Drug use: No  . Sexual activity: Yes  Lifestyle  . Physical activity:    Days per week: Not on file    Minutes per session: Not on file  . Stress: Not on file  Relationships  . Social connections:    Talks on phone: Not on file    Gets together: Not on file    Attends religious service: Not  on file    Active member of club or organization: Not on file    Attends meetings of clubs or organizations: Not on file    Relationship status: Not on file  . Intimate partner violence:    Fear of current or ex partner: Not on file    Emotionally abused: Not on file    Physically abused: Not on file    Forced sexual activity: Not on file  Other Topics Concern  . Not on file  Social History Narrative   Married.   3 children.   Moved from [place].   Worked for [employer].   Enjoys hiking, spending time outdoors.     Family History  Problem Relation Age of Onset  . Hyperlipidemia Mother   . Hypertension Mother   . Heart disease Father   . Heart attack Father   . Heart attack Sister   . Stroke  Sister   . Alcohol abuse Brother   . Stroke Brother   . Early death Sister   . Heart disease Brother   . Heart attack Brother     ROS: No fever, myalgias/arthralgias, unexplained weight loss or weight gain No new focal weakness or sensory deficits No otalgia, hearing loss, visual changes, nasal and sinus symptoms, mouth and throat problems No neck pain or adenopathy No abdominal pain, N/V/D, diarrhea, change in bowel pattern No dysuria, change in urinary pattern   Vitals:   01/01/19 1116  BP: 122/76  Pulse: 70  Temp: 98.1 F (36.7 C)  TempSrc: Oral  SpO2: 98%  Weight: 192 lb 9.6 oz (87.4 kg)  Height: 6' (1.829 m)     EXAM:  Gen: WDWN, No overt respiratory distress HEENT: NCAT, sclera white, oropharynx normal.  Very mild rhinitis, L TM sclerotic Neck: Supple without LAN, thyromegaly, JVD Lungs: breath sounds slightly coarse, percussion normal, adventitious sounds: Minimal rhonchi Cardiovascular: RRR, no murmurs noted Abdomen: Soft, nontender, normal BS Ext: without clubbing, cyanosis, edema Neuro: CNs grossly intact, motor and sensory intact Skin: Limited exam, no lesions noted  DATA:   BMP Latest Ref Rng & Units 08/14/2017  Glucose 70 - 99 mg/dL 100(H)  BUN 6 - 23 mg/dL 22  Creatinine 0.40 - 1.50 mg/dL 0.98  Sodium 135 - 145 mEq/L 138  Potassium 3.5 - 5.1 mEq/L 4.8  Chloride 96 - 112 mEq/L 104  CO2 19 - 32 mEq/L 30  Calcium 8.4 - 10.5 mg/dL 9.2    CBC Latest Ref Rng & Units 10/25/2018  WBC 3.8 - 10.8 Thousand/uL 6.3  Hemoglobin 13.2 - 17.1 g/dL 14.2  Hematocrit 38.5 - 50.0 % 41.4  Platelets 140 - 400 Thousand/uL 245    CXR 12/24/2018: No acute or chronic cardiac or pulmonary findings  I have personally reviewed all chest radiographs reported above including CXRs and CT chest unless otherwise indicated  IMPRESSION:     ICD-10-CM   1. Chronic cough R05   2. Chronic rhinitis J31.0   3. Rhonchi R09.89    Suspect component of cyclical cough or habit  cough  PLAN:  Change omeprazole to 40 mg daily and recommended that he take it between dinnertime and bedtime  Continue Flonase or equivalent steroid inhaler daily as previously prescribed  New prescription: Flovent 110 mcg, 2 actuations twice daily.  Instructed to rinse mouth after use  Aggressive cough suppression as follows: Tessalon Perles 200 mg 3 times daily for 5 days, then every 8 hours as needed Delsym 15 mg 3 times daily for  5 days, then every 8 hours as needed  Follow-up in 4-6 weeks.  He is to call sooner if needed.  If symptoms persist, further evaluation could include PFTs, CT scan sinuses   Merton Border, MD PCCM service Mobile [phone] Pager [phone] 01/03/2019 10:20 AM

## 2019-01-03 NOTE — Telephone Encounter (Signed)
[phone] [facility].  Pt states that he reached out to his insurance & there really isn't a generic for flovent.  Pt asked if he could get 1 month Rx sent to [facility].

## 2019-01-03 NOTE — Telephone Encounter (Signed)
Left message with pt's spouse, requesting that pt return our call.

## 2019-01-03 NOTE — Telephone Encounter (Signed)
I spoke to patient and advised him to call his insurance and see what might be on the formulary in the same class that might be less expensive for him. He will reach out to them and let us know.

## 2019-01-13 ENCOUNTER — Other Ambulatory Visit: Payer: Self-pay | Admitting: Primary Care

## 2019-01-13 DIAGNOSIS — E785 Hyperlipidemia, unspecified: Secondary | ICD-10-CM

## 2019-01-13 DIAGNOSIS — N400 Enlarged prostate without lower urinary tract symptoms: Secondary | ICD-10-CM

## 2019-01-16 ENCOUNTER — Other Ambulatory Visit: Payer: Self-pay | Admitting: Primary Care

## 2019-01-16 DIAGNOSIS — R053 Chronic cough: Secondary | ICD-10-CM

## 2019-01-16 DIAGNOSIS — R05 Cough: Secondary | ICD-10-CM

## 2019-01-22 ENCOUNTER — Telehealth: Payer: Self-pay | Admitting: Pulmonary Disease

## 2019-01-22 NOTE — Telephone Encounter (Signed)
Pt's spouse, Michele(dpr) is aware of below recommendations and voiced her understanding.  Nothing further is needed.

## 2019-01-22 NOTE — Telephone Encounter (Signed)
Have him discontinue the Flovent inhaler now. The hoarseness and other vocal cord symptoms could be an uncommon side effect of this inhaler. Continue everything else as previously  Thanks  Waunita Schooner

## 2019-01-22 NOTE — Telephone Encounter (Signed)
Pt called and stated that he feels he needs to be seen sooner than 02/04/2019 because he doesn't feel like "he is going in the right directions."  Has a weird feeling in his throat that's causing hoarseness (doesn't feel like a sore throat), coughing up phlegm, little runny nose, denies fever.

## 2019-01-22 NOTE — Telephone Encounter (Signed)
I spoke to daughter, she was concerned that patient did not call us this morning. I reassured her that we had spoken to the patient as well as Dr. Alva Garnet regarding his symptoms. The plan is for patient to discontinue the flovent inhaler at this time and let us know if his symptoms do not improve. Nothing further needed at this time.

## 2019-01-22 NOTE — Telephone Encounter (Signed)
Spoke to patient. He states his cough is pretty much gone, but now having some tightness in his throat, having trouble talking without forcing it out. This has just started in the last three weeks. Not as much phlem as before. Wants to be seen sooner.   DS please advise. OK to move up apt?

## 2019-02-03 ENCOUNTER — Ambulatory Visit: Payer: Medicare Other | Admitting: Pulmonary Disease

## 2019-02-04 ENCOUNTER — Ambulatory Visit: Payer: Medicare Other | Admitting: Pulmonary Disease

## 2019-02-05 ENCOUNTER — Telehealth: Payer: Self-pay | Admitting: Pulmonary Disease

## 2019-02-05 NOTE — Telephone Encounter (Signed)
Called patient for COVID-19 pre-screening for in office visit.  Have you recently traveled any where out of the local area in the last 2 weeks? [place], [place]- returned last Saturday   Have you been in close contact with a person diagnosed with COVID-19 or someone awaiting results within the last 2 weeks? No  Do you currently have any of the following symptoms? If so, when did they start? Cough     Diarrhea   Joint Pain Fever      Muscle Pain   Red eyes Shortness of breath   Abdominal pain  Vomiting Loss of smell    Rash    Sore Throat Headache    Weakness   Bruising or bleeding   Okay to proceed with visit. (date)  / Needs to reschedule visit. (date)

## 2019-02-05 NOTE — Telephone Encounter (Signed)
With no symptoms I think he would be okay to proceed.

## 2019-02-06 ENCOUNTER — Ambulatory Visit (INDEPENDENT_AMBULATORY_CARE_PROVIDER_SITE_OTHER): Payer: Medicare Other | Admitting: Pulmonary Disease

## 2019-02-06 ENCOUNTER — Encounter: Payer: Self-pay | Admitting: Pulmonary Disease

## 2019-02-06 ENCOUNTER — Other Ambulatory Visit: Payer: Self-pay

## 2019-02-06 VITALS — BP 118/70 | HR 61 | Temp 97.2°F | Ht 72.0 in | Wt 189.8 lb

## 2019-02-06 DIAGNOSIS — R05 Cough: Secondary | ICD-10-CM | POA: Diagnosis not present

## 2019-02-06 DIAGNOSIS — R053 Chronic cough: Secondary | ICD-10-CM

## 2019-02-06 DIAGNOSIS — J310 Chronic rhinitis: Secondary | ICD-10-CM | POA: Diagnosis not present

## 2019-02-06 DIAGNOSIS — K219 Gastro-esophageal reflux disease without esophagitis: Secondary | ICD-10-CM | POA: Diagnosis not present

## 2019-02-06 MED ORDER — FLUTICASONE PROPIONATE 50 MCG/ACT NA SUSP: 2.0000 | Freq: Every day | NASAL | 10 refills | Status: AC

## 2019-02-06 MED ORDER — BENZONATATE 200 MG PO CAPS: 200.0000 mg | ORAL_CAPSULE | Freq: Three times a day (TID) | ORAL | 5 refills | Status: DC | PRN

## 2019-02-06 NOTE — Progress Notes (Signed)
PULMONARY OFFICE FOLLOW-UP NOTE  Requesting MD/Service: Alma Friendly, NP Date of initial consultation: 01/01/19 Reason for consultation: chronic cough  PT PROFILE: 83 y.o. male never smoker referred for evaluation of chronic cough of 5 months duration  DATA:  INTERVAL: Initial consultation 01/01/19.  No major events in the interim.  SUBJ:  This is a first follow-up visit.  He was initially seen 01/01/2019 for chronic cough.  At that time, we doubled omeprazole and I recommended that he take it in the evening after dinner.  I encouraged him to remain on Flonase inhaler.  I initiated Flovent inhaler.  I recommended aggressive cough suppression for the possibility of a "cyclical" component to his cough.  In the interim, he developed a scratchy throat and throat tightness which I thought might be due to the ICS.  Therefore, I recommended that he discontinue the Flovent and the symptoms resolved.  He returns today for routine reevaluation.  His cough is much improved.  He has some cough but no longer the vigorous paroxysms.  He remains on omeprazole daily and Flonase.  He uses [facility] from time to time as needed.  Cough is exacerbated by drinking cold liquids.  He has learned to drink liquids that are room temperature.  He has no new complaints.  He denies CP, fever, purulent sputum, hemoptysis, LE edema and calf tenderness.   Vitals:   02/06/19 1053 02/06/19 1054  BP:  118/70  Pulse:  61  Temp:  (!) 97.2 F (36.2 C)  TempSrc:  Temporal  SpO2:  98%  Weight: 189 lb 12.8 oz (86.1 kg)   Height: 6' (1.829 m)   Room air   EXAM:  Gen: NAD HEENT: NCAT, sclerae white. B TMs retracted and erythematous with scarring Neck: No JVD noted  Lungs: breath sounds full, no wheezes or other adventitious sounds Cardiovascular: RRR, no murmurs Abdomen: Soft, nontender, normal BS Ext: without clubbing, cyanosis, edema Neuro: grossly intact Skin: Limited exam, no lesions noted   DATA:   BMP  Latest Ref Rng & Units 08/14/2017  Glucose 70 - 99 mg/dL 100(H)  BUN 6 - 23 mg/dL 22  Creatinine 0.40 - 1.50 mg/dL 0.98  Sodium 135 - 145 mEq/L 138  Potassium 3.5 - 5.1 mEq/L 4.8  Chloride 96 - 112 mEq/L 104  CO2 19 - 32 mEq/L 30  Calcium 8.4 - 10.5 mg/dL 9.2    CBC Latest Ref Rng & Units 10/25/2018  WBC 3.8 - 10.8 Thousand/uL 6.3  Hemoglobin 13.2 - 17.1 g/dL 14.2  Hematocrit 38.5 - 50.0 % 41.4  Platelets 140 - 400 Thousand/uL 245    CXR: No new film  I have personally reviewed all chest radiographs reported above including CXRs and CT chest unless otherwise indicated  IMPRESSION:     ICD-10-CM   1. Chronic cough  R05   2. Chronic rhinitis  J31.0   3. Chronic GERD  K21.9      PLAN:  Continue omeprazole 40 mg daily taken after dinnertime Continue Flonase nasal spray, 2 sprays per nostril daily Continue dextromethorphan and/or Tessalon Perles as needed for cough suppression Follow-up in this office as needed    Merton Border, MD PCCM service Mobile [phone] Pager [phone] 02/06/2019 11:18 AM

## 2019-02-06 NOTE — Addendum Note (Signed)
Addended by: Darreld Mclean on: 02/06/2019 11:22 AM   Modules accepted: Orders

## 2019-02-06 NOTE — Patient Instructions (Signed)
Continue omeprazole 40 mg daily taken after dinnertime Continue Flonase nasal spray, 2 sprays per nostril daily Follow-up in this office as needed

## 2019-02-14 ENCOUNTER — Encounter: Payer: Self-pay | Admitting: Primary Care

## 2019-02-14 ENCOUNTER — Ambulatory Visit (INDEPENDENT_AMBULATORY_CARE_PROVIDER_SITE_OTHER): Payer: Medicare Other | Admitting: Primary Care

## 2019-02-14 VITALS — Wt 189.0 lb

## 2019-02-14 DIAGNOSIS — N529 Male erectile dysfunction, unspecified: Secondary | ICD-10-CM

## 2019-02-14 MED ORDER — SILDENAFIL CITRATE 20 MG PO TABS: ORAL_TABLET | ORAL | 0 refills | Status: AC

## 2019-02-14 NOTE — Patient Instructions (Addendum)
  You may take the sildenafil 20 mg tablets as needed for erection.  Start with 2 tablets by mouth 30 minutes prior to intercourse, max dose of 5 tablets in 24 hours.  It was a pleasure to see you today! [name], NP-

## 2019-02-14 NOTE — Progress Notes (Signed)
Subjective:    Patient ID: Julian Ellis, male    DOB: 03-Jun-1936, 83 y.o.   MRN: [id number]  HPI  Virtual Visit via Video Note  I connected with [name] on 02/14/19 at  2:00 PM EDT by a video enabled telemedicine application and verified that I am speaking with the correct person using two identifiers.  Location: Patient: Home Provider: Office   I discussed the limitations of evaluation and management by telemedicine and the availability of in person appointments. The patient expressed understanding and agreed to proceed.  History of Present Illness:  Julian Ellis is an 83 year old male with a history of TIA, BPH, hyperlipidemia who presents today to discuss erectile dysfunction.  He is managed on tamsulosin 0.4 mg daily. He was once managed on Viagra 50 mg in the past. He has difficulty obtaining an erection and would like to resume his Viagra for which he took as needed. He had no reactions or problems when taking Viagra.   Observations/Objective:  Alert and oriented. No distress. Speaking in complete sentences.'  Assessment and Plan:  History of erectile dysfunction for years, did well on Viagra 50 mg. Agree to provide prescription. He is aware of potential side effects. Rx for sildenafil 20 mg sent to pharmacy. He will update.  Follow Up Instructions:  You may take the sildenafil 20 mg tablets as needed for erection.  Start with 2 tablets by mouth 30 minutes prior to intercourse, max dose of 5 tablets in 24 hours.  It was a pleasure to see you today! [name], NP-   I discussed the assessment and treatment plan with the patient. The patient was provided an opportunity to ask questions and all were answered. The patient agreed with the plan and demonstrated an understanding of the instructions.   The patient was advised to call back or seek an in-person evaluation if the symptoms worsen or if the condition fails to improve as anticipated.     [name], NP    Review of Systems  Cardiovascular: Negative for chest pain.  Genitourinary: Negative for difficulty urinating, dysuria and urgency.       Erectile dysfunction  Neurological: Negative for dizziness.       Past Medical History:  Diagnosis Date  . Blood in stool   . BPH (benign prostatic hyperplasia)   . Hyperlipidemia   . Seasonal allergies   . TIA (transient ischemic attack)   . Urine incontinence      Social History   Socioeconomic History  . Marital status: Married    Spouse name: Not on file  . Number of children: Not on file  . Years of education: Not on file  . Highest education level: Not on file  Occupational History  . Not on file  Social Needs  . Financial resource strain: Not on file  . Food insecurity    Worry: Not on file    Inability: Not on file  . Transportation needs    Medical: Not on file    Non-medical: Not on file  Tobacco Use  . Smoking status: Never Smoker  . Smokeless tobacco: Never Used  Substance and Sexual Activity  . Alcohol use: No    Frequency: Never  . Drug use: No  . Sexual activity: Yes  Lifestyle  . Physical activity    Days per week: Not on file    Minutes per session: Not on file  . Stress: Not on file  Relationships  .  Social Herbalist on phone: Not on file    Gets together: Not on file    Attends religious service: Not on file    Active member of club or organization: Not on file    Attends meetings of clubs or organizations: Not on file    Relationship status: Not on file  . Intimate partner violence    Fear of current or ex partner: Not on file    Emotionally abused: Not on file    Physically abused: Not on file    Forced sexual activity: Not on file  Other Topics Concern  . Not on file  Social History Narrative   Married.   3 children.   Moved from [place].   Worked for [employer].   Enjoys hiking, spending time outdoors.     Past Surgical History:  Procedure  Laterality Date  . BACK SURGERY  2010  . NECK SURGERY  2018  . TONSILLECTOMY  1964    Family History  Problem Relation Age of Onset  . Hyperlipidemia Mother   . Hypertension Mother   . Heart disease Father   . Heart attack Father   . Heart attack Sister   . Stroke Sister   . Alcohol abuse Brother   . Stroke Brother   . Early death Sister   . Heart disease Brother   . Heart attack Brother     Allergies  Allergen Reactions  . Compazine [Prochlorperazine] Anaphylaxis  . Gluten Meal     Other reaction(s): Other (See Comments)  . Sulfamethoxazole     Other reaction(s): Other (See Comments) unknown  . Wheat Bran     Other reaction(s): Other (See Comments)    Current Outpatient Medications on File Prior to Visit  Medication Sig Dispense Refill  . aspirin 325 MG EC tablet Take 325 mg by mouth daily.    . benzonatate (TESSALON) 200 MG capsule Take 1 capsule (200 mg total) by mouth 3 (three) times daily as needed for cough. 50 capsule 5  . dextromethorphan 15 MG/5ML syrup Take 5 mLs (15 mg total) by mouth 3 (three) times daily. Take 3 times daily for 5 days.  Then every 8 hours as needed for cough 240 mL 5  . fluticasone (FLONASE) 50 MCG/ACT nasal spray Place 2 sprays into both nostrils daily. 18.2 mL 10  . latanoprost (XALATAN) 0.005 % ophthalmic solution Place 1 drop into both eyes at bedtime.    . Multiple Vitamin (MULTIVITAMIN) tablet Take 1 tablet by mouth daily.    Marland Kitchen omeprazole (PRILOSEC) 20 MG capsule TAKE 1 CAPSULE (20 MG TOTAL) BY MOUTH DAILY. FOR HEARTBURN/COUGH. 90 capsule 0  . simvastatin (ZOCOR) 10 MG tablet TAKE 1 TABLET BY MOUTH  DAILY 90 tablet 0  . tamsulosin (FLOMAX) 0.4 MG CAPS capsule TAKE 1 CAPSULE BY MOUTH  DAILY 90 capsule 0  . timolol (BETIMOL) 0.5 % ophthalmic solution Place 1 drop into both eyes daily.     No current facility-administered medications on file prior to visit.     Wt 189 lb (85.7 kg)   BMI 25.63 kg/m    Objective:   Physical Exam   Constitutional: He is oriented to person, place, and time.  Neurological: He is alert and oriented to person, place, and time.  Psychiatric: He has a normal mood and affect.           Assessment & Plan:

## 2019-02-14 NOTE — Assessment & Plan Note (Signed)
Chronic for years, once managed on Viagra and did well. Agree to refill and will send in generic sildenafil 20 mg tablets.  Discussed to start with 2 tablets 30 minutes prior to intercourse as needed.  Max dose of 5 tablets.

## 2019-04-07 ENCOUNTER — Telehealth: Payer: Self-pay | Admitting: Primary Care

## 2019-04-07 NOTE — Telephone Encounter (Signed)
Patient and his wife have started taking Prevagen for memory loss.  Patient said it's very expensive and wants to know if there's a prescription that they could use for memory loss.  Patient uses [facility].

## 2019-04-07 NOTE — Telephone Encounter (Signed)
Please notify patient that there's not much over the counter treatment that I recommend, and prescription medication is more for dementia (Alzheimer's). I always recommend working puzzles, remaining active, getting plenty of exercise. I'm happy to meet with him to discuss his symptoms anytime at his convenience.

## 2019-04-08 NOTE — Telephone Encounter (Signed)
Spoken and notified patient of Kate Clark's comments. Patient verbalized understanding.  

## 2019-04-09 ENCOUNTER — Ambulatory Visit (INDEPENDENT_AMBULATORY_CARE_PROVIDER_SITE_OTHER): Payer: Medicare Other

## 2019-04-09 DIAGNOSIS — Z23 Encounter for immunization: Secondary | ICD-10-CM

## 2019-05-22 DIAGNOSIS — B079 Viral wart, unspecified: Secondary | ICD-10-CM | POA: Diagnosis not present

## 2019-05-22 DIAGNOSIS — L814 Other melanin hyperpigmentation: Secondary | ICD-10-CM | POA: Diagnosis not present

## 2019-05-22 DIAGNOSIS — C44319 Basal cell carcinoma of skin of other parts of face: Secondary | ICD-10-CM | POA: Diagnosis not present

## 2019-05-22 DIAGNOSIS — D485 Neoplasm of uncertain behavior of skin: Secondary | ICD-10-CM | POA: Diagnosis not present

## 2019-06-18 ENCOUNTER — Other Ambulatory Visit: Payer: Self-pay | Admitting: Primary Care

## 2019-06-18 DIAGNOSIS — E785 Hyperlipidemia, unspecified: Secondary | ICD-10-CM

## 2019-06-18 DIAGNOSIS — N400 Enlarged prostate without lower urinary tract symptoms: Secondary | ICD-10-CM

## 2019-08-05 DIAGNOSIS — C44319 Basal cell carcinoma of skin of other parts of face: Secondary | ICD-10-CM | POA: Diagnosis not present

## 2019-09-08 ENCOUNTER — Ambulatory Visit (INDEPENDENT_AMBULATORY_CARE_PROVIDER_SITE_OTHER): Payer: Medicare Other | Admitting: Family

## 2019-09-08 ENCOUNTER — Other Ambulatory Visit: Payer: Self-pay | Admitting: Family

## 2019-09-08 ENCOUNTER — Encounter: Payer: Self-pay | Admitting: Family

## 2019-09-08 ENCOUNTER — Other Ambulatory Visit: Payer: Self-pay

## 2019-09-08 VITALS — Ht 72.0 in | Wt 185.0 lb

## 2019-09-08 DIAGNOSIS — G459 Transient cerebral ischemic attack, unspecified: Secondary | ICD-10-CM

## 2019-09-08 DIAGNOSIS — N400 Enlarged prostate without lower urinary tract symptoms: Secondary | ICD-10-CM

## 2019-09-08 DIAGNOSIS — Z7689 Persons encountering health services in other specified circumstances: Secondary | ICD-10-CM

## 2019-09-08 DIAGNOSIS — E785 Hyperlipidemia, unspecified: Secondary | ICD-10-CM

## 2019-09-08 DIAGNOSIS — H409 Unspecified glaucoma: Secondary | ICD-10-CM

## 2019-09-08 DIAGNOSIS — N529 Male erectile dysfunction, unspecified: Secondary | ICD-10-CM

## 2019-09-08 NOTE — Assessment & Plan Note (Signed)
Prn revatio.

## 2019-09-08 NOTE — Assessment & Plan Note (Signed)
Tolerating medication, will continue.  pending lipid panel

## 2019-09-08 NOTE — Assessment & Plan Note (Addendum)
History of one suspected TIA, discussed long-term secondary prevention of stroke, including cholesterol/ blood presure in reasonable range, antiplatelet therapy.  Currently patient is only on low-dose aspirin.  Advised the combination of aspirin and plavix may have modest benefit however he feels comfortable in currently taking baby aspirin alone.  He is not suffering from any bleeding.  He declined seeing neurologist for another opinion in regards to Plavix at this time.  We will certainly stay vigilant and he let me know if he has any recurrence of strokelike episode.

## 2019-09-08 NOTE — Assessment & Plan Note (Signed)
Pending psa.  Symptoms appear stable on Flomax.

## 2019-09-08 NOTE — Progress Notes (Signed)
Virtual Visit via Video Note  I connected with@  on 09/08/19 at  1:30 PM EST by a video enabled telemedicine application and verified that I am speaking with the correct person using two identifiers.  Location patient: home Location provider:work  Persons participating in the virtual visit: patient, provider  I discussed the limitations of evaluation and management by telemedicine and the availability of in person appointments. The patient expressed understanding and agreed to proceed.  Interactive audio and video telecommunications were attempted between this provider and patient, however failed, due to patient having technical difficulties or patient did not have access to video capability.  We continued and completed visit with audio only.   HPI: Feels well Has completed covid vaccine.   H/o tia - states had some vision changes , dizziness around 8 years ago. In [place] at that time. No recurrence of symptoms. Had been on Clopidogrel at the [place], was taken off this medication around 4 years off. Advised to start 81mg  Asa. No h/o GIB.  No bleeding.   BPH- urinates twice per night, doing well on flomax. Urinary hesitancy. No h/o prostate biopsy.   Chronic cough-resolved. No longer follow with Dr. Alva Garnet Uses revatio on occasion. No concerns with medication.   No h/ o HTN. Never had to seen cardiology Follows with dermatology Follows with ophthalmologist for glaucoma.   ROS: See pertinent positives and negatives per HPI.  Past Medical History:  Diagnosis Date  . Blood in stool   . BPH (benign prostatic hyperplasia)   . Hyperlipidemia   . Seasonal allergies   . TIA (transient ischemic attack)   . Urine incontinence     Past Surgical History:  Procedure Laterality Date  . BACK SURGERY  2010  . NECK SURGERY  2018  . TONSILLECTOMY  1964    Family History  Problem Relation Age of Onset  . Hyperlipidemia Mother   . Hypertension Mother   . Heart disease Father   . Heart  attack Father   . Heart attack Sister   . Stroke Sister   . Alcohol abuse Brother   . Stroke Brother   . Early death Sister   . Heart disease Brother   . Heart attack Brother     SOCIAL HX: never smoker   Current Outpatient Medications:  .  aspirin EC 81 MG tablet, Take 81 mg by mouth daily., Disp: , Rfl:  .  fluticasone (FLONASE) 50 MCG/ACT nasal spray, Place 2 sprays into both nostrils daily., Disp: 18.2 mL, Rfl: 10 .  latanoprost (XALATAN) 0.005 % ophthalmic solution, Place 1 drop into both eyes at bedtime., Disp: , Rfl:  .  Multiple Vitamin (MULTIVITAMIN) tablet, Take 1 tablet by mouth daily., Disp: , Rfl:  .  simvastatin (ZOCOR) 10 MG tablet, TAKE 1 TABLET BY MOUTH  DAILY, Disp: 90 tablet, Rfl: 0 .  tamsulosin (FLOMAX) 0.4 MG CAPS capsule, TAKE 1 CAPSULE BY MOUTH  DAILY, Disp: 90 capsule, Rfl: 0 .  timolol (BETIMOL) 0.5 % ophthalmic solution, Place 1 drop into both eyes daily., Disp: , Rfl:  .  sildenafil (REVATIO) 20 MG tablet, Take 2 to 5 tablets by mouth 30 minutes prior to intercourse as needed. (Patient not taking: Reported on 09/08/2019), Disp: 50 tablet, Rfl: 0      ASSESSMENT AND PLAN:  Discussed the following assessment and plan:  Encounter to establish care  Hyperlipidemia, unspecified hyperlipidemia type - Plan: Comprehensive metabolic panel, Lipid panel  Benign prostatic hyperplasia without lower urinary tract symptoms -  Plan: PSA  TIA (transient ischemic attack)  Erectile dysfunction, unspecified erectile dysfunction type  Glaucoma, unspecified glaucoma type, unspecified laterality Problem List Items Addressed This Visit      Cardiovascular and Mediastinum   TIA (transient ischemic attack)    History of one suspected TIA, discussed long-term secondary prevention of stroke, including cholesterol/ blood presure in reasonable range, antiplatelet therapy.  Currently patient is only on low-dose aspirin.  Advised the combination of aspirin and plavix may have  modest benefit however he feels comfortable in currently taking baby aspirin alone.  He is not suffering from any bleeding.  He declined seeing neurologist for another opinion in regards to Plavix at this time.  We will certainly stay vigilant and he let me know if he has any recurrence of strokelike episode.      Relevant Medications   aspirin EC 81 MG tablet     Genitourinary   BPH (benign prostatic hyperplasia)    Pending psa.  Symptoms appear stable on Flomax.      Relevant Orders   PSA     Other   Encounter to establish care - Primary   Erectile dysfunction    Prn revatio.      Glaucoma   Hyperlipidemia    Tolerating medication, will continue.  pending lipid panel      Relevant Medications   aspirin EC 81 MG tablet   Other Relevant Orders   Comprehensive metabolic panel   Lipid panel      -we discussed possible serious and likely etiologies, options for evaluation and workup, limitations of telemedicine visit vs in person visit, treatment, treatment risks and precautions. Pt prefers to treat via telemedicine empirically rather then risking or undertaking an in person visit at this moment. Patient agrees to seek prompt in person care if worsening, new symptoms arise, or if is not improving with treatment.   I discussed the assessment and treatment plan with the patient. The patient was provided an opportunity to ask questions and all were answered. The patient agreed with the plan and demonstrated an understanding of the instructions.   The patient was advised to call back or seek an in-person evaluation if the symptoms worsen or if the condition fails to improve as anticipated.   Mable Paris, FNP   I spent 18 min non face to face w/ pt.

## 2019-09-10 ENCOUNTER — Other Ambulatory Visit: Payer: Medicare Other

## 2019-09-24 ENCOUNTER — Other Ambulatory Visit: Payer: Self-pay

## 2019-09-24 ENCOUNTER — Ambulatory Visit (INDEPENDENT_AMBULATORY_CARE_PROVIDER_SITE_OTHER): Payer: Medicare Other

## 2019-09-24 ENCOUNTER — Other Ambulatory Visit: Payer: Medicare Other

## 2019-09-24 ENCOUNTER — Other Ambulatory Visit (INDEPENDENT_AMBULATORY_CARE_PROVIDER_SITE_OTHER): Payer: Medicare Other

## 2019-09-24 ENCOUNTER — Ambulatory Visit: Payer: Medicare Other

## 2019-09-24 DIAGNOSIS — E785 Hyperlipidemia, unspecified: Secondary | ICD-10-CM

## 2019-09-24 DIAGNOSIS — Z23 Encounter for immunization: Secondary | ICD-10-CM

## 2019-09-24 DIAGNOSIS — N400 Enlarged prostate without lower urinary tract symptoms: Secondary | ICD-10-CM

## 2019-09-24 NOTE — Addendum Note (Signed)
Addended by: Leeanne Rio on: 09/24/2019 12:56 PM   Modules accepted: Orders

## 2019-09-24 NOTE — Progress Notes (Addendum)
Patient presented for Prevnar 13 injection to left deltoid, patient voiced no concerns nor showed any signs of distress during injection. Agree with plan. Mable Paris, NP

## 2019-09-25 ENCOUNTER — Ambulatory Visit: Payer: Medicare Other

## 2019-09-25 LAB — COMPREHENSIVE METABOLIC PANEL
AG Ratio: 2.2 (calc) (ref 1.0–2.5)
ALT: 22 U/L (ref 9–46)
AST: 21 U/L (ref 10–35)
Albumin: 4.7 g/dL (ref 3.6–5.1)
Alkaline phosphatase (APISO): 39 U/L (ref 35–144)
BUN: 12 mg/dL (ref 7–25)
CO2: 27 mmol/L (ref 20–32)
Calcium: 9.5 mg/dL (ref 8.6–10.3)
Chloride: 102 mmol/L (ref 98–110)
Creat: 0.93 mg/dL (ref 0.70–1.11)
Globulin: 2.1 g/dL (calc) (ref 1.9–3.7)
Glucose, Bld: 99 mg/dL (ref 65–99)
Potassium: 4.6 mmol/L (ref 3.5–5.3)
Sodium: 138 mmol/L (ref 135–146)
Total Bilirubin: 0.6 mg/dL (ref 0.2–1.2)
Total Protein: 6.8 g/dL (ref 6.1–8.1)

## 2019-09-25 LAB — LIPID PANEL
Cholesterol: 142 mg/dL (ref <200)
HDL: 43 mg/dL (ref >=40)
LDL Cholesterol (Calc): 80 mg/dL (calc)
Non-HDL Cholesterol (Calc): 99 mg/dL (calc) (ref <130)
Total CHOL/HDL Ratio: 3.3 (calc) (ref <5.0)
Triglycerides: 102 mg/dL (ref <150)

## 2019-09-25 LAB — PSA: PSA: 2.3 ng/mL (ref <=4.0)

## 2019-09-29 ENCOUNTER — Other Ambulatory Visit: Payer: Self-pay | Admitting: Family

## 2019-09-29 DIAGNOSIS — E785 Hyperlipidemia, unspecified: Secondary | ICD-10-CM

## 2019-09-30 ENCOUNTER — Other Ambulatory Visit: Payer: Self-pay

## 2019-09-30 DIAGNOSIS — E785 Hyperlipidemia, unspecified: Secondary | ICD-10-CM

## 2019-09-30 MED ORDER — SIMVASTATIN 20 MG PO TABS: 20.0000 mg | ORAL_TABLET | Freq: Every day | ORAL | 1 refills | Status: AC

## 2019-10-09 ENCOUNTER — Other Ambulatory Visit: Payer: Medicare Other

## 2019-10-15 ENCOUNTER — Ambulatory Visit
Admission: RE | Admit: 2019-10-15 | Discharge: 2019-10-15 | Disposition: A | Discharge status: Home / Self Care | Payer: Medicare Other | Source: Ambulatory Visit | Attending: Family | Admitting: Family

## 2019-10-15 ENCOUNTER — Other Ambulatory Visit: Payer: Self-pay

## 2019-10-15 ENCOUNTER — Ambulatory Visit: Payer: Medicare Other

## 2019-10-15 ENCOUNTER — Other Ambulatory Visit: Payer: Medicare Other

## 2019-10-15 DIAGNOSIS — I6523 Occlusion and stenosis of bilateral carotid arteries: Secondary | ICD-10-CM | POA: Diagnosis not present

## 2019-10-15 DIAGNOSIS — G459 Transient cerebral ischemic attack, unspecified: Secondary | ICD-10-CM | POA: Diagnosis not present

## 2019-11-11 ENCOUNTER — Other Ambulatory Visit: Payer: Self-pay

## 2019-11-11 ENCOUNTER — Other Ambulatory Visit (INDEPENDENT_AMBULATORY_CARE_PROVIDER_SITE_OTHER): Payer: Medicare Other

## 2019-11-11 DIAGNOSIS — E785 Hyperlipidemia, unspecified: Secondary | ICD-10-CM

## 2019-11-11 LAB — COMPREHENSIVE METABOLIC PANEL
ALT: 25 U/L (ref 0–53)
AST: 26 U/L (ref 0–37)
Albumin: 4.1 g/dL (ref 3.5–5.2)
Alkaline Phosphatase: 36 U/L — ABNORMAL LOW (ref 39–117)
BUN: 15 mg/dL (ref 6–23)
CO2: 29 mEq/L (ref 19–32)
Calcium: 9.1 mg/dL (ref 8.4–10.5)
Chloride: 103 mEq/L (ref 96–112)
Creatinine, Ser: 0.92 mg/dL (ref 0.40–1.50)
GFR: 78.78 mL/min (ref >60.00)
Glucose, Bld: 90 mg/dL (ref 70–99)
Potassium: 5 mEq/L (ref 3.5–5.1)
Sodium: 137 mEq/L (ref 135–145)
Total Bilirubin: 0.5 mg/dL (ref 0.2–1.2)
Total Protein: 6.1 g/dL (ref 6.0–8.3)

## 2019-11-18 ENCOUNTER — Ambulatory Visit (INDEPENDENT_AMBULATORY_CARE_PROVIDER_SITE_OTHER): Payer: Medicare Other

## 2019-11-18 VITALS — Ht 72.0 in | Wt 185.0 lb

## 2019-11-18 DIAGNOSIS — Z0000 Encounter for general adult medical examination without abnormal findings: Secondary | ICD-10-CM | POA: Diagnosis not present

## 2019-11-18 NOTE — Progress Notes (Addendum)
Subjective:   Julian Ellis is a 84 y.o. male who presents for an Initial Medicare Annual Wellness Visit.  Review of Systems  No ROS.  Medicare Wellness Virtual Visit.  Visual/audio telehealth visit, UTA vital signs.   Ht/Wt provided.  See social history for additional risk factors.  Cardiac Risk Factors include: advanced age (>34men, >75 women);male gender    Objective:    Today's Vitals   11/18/19 0952  Weight: 185 lb (83.9 kg)  Height: 6' (1.829 m)   Body mass index is 25.09 kg/m.  Advanced Directives 11/18/2019  Does Patient Have a Medical Advance Directive? Yes  Does patient want to make changes to medical advance directive? No - Patient declined    Current Medications (verified) Outpatient Encounter Medications as of 11/18/2019  Medication Sig  . aspirin EC 81 MG tablet Take 81 mg by mouth daily.  . fluticasone (FLONASE) 50 MCG/ACT nasal spray Place 2 sprays into both nostrils daily.  Marland Kitchen latanoprost (XALATAN) 0.005 % ophthalmic solution Place 1 drop into both eyes at bedtime.  . Multiple Vitamin (MULTIVITAMIN) tablet Take 1 tablet by mouth daily.  . sildenafil (REVATIO) 20 MG tablet Take 2 to 5 tablets by mouth 30 minutes prior to intercourse as needed.  . simvastatin (ZOCOR) 20 MG tablet Take 1 tablet (20 mg total) by mouth daily.  . tamsulosin (FLOMAX) 0.4 MG CAPS capsule TAKE 1 CAPSULE BY MOUTH  DAILY  . timolol (BETIMOL) 0.5 % ophthalmic solution Place 1 drop into both eyes daily.   No facility-administered encounter medications on file as of 11/18/2019.    Allergies (verified) Compazine [prochlorperazine], Gluten meal, Sulfamethoxazole, and Wheat bran   History: Past Medical History:  Diagnosis Date  . Blood in stool   . BPH (benign prostatic hyperplasia)   . Hyperlipidemia   . Seasonal allergies   . TIA (transient ischemic attack)   . Urine incontinence    Past Surgical History:  Procedure Laterality Date  . BACK SURGERY  2010  . NECK SURGERY   2018  . TONSILLECTOMY  1964   Family History  Problem Relation Age of Onset  . Hyperlipidemia Mother   . Hypertension Mother   . Heart disease Father   . Heart attack Father   . Heart attack Sister   . Stroke Sister   . Alcohol abuse Brother   . Stroke Brother   . Early death Sister   . Heart disease Brother   . Heart attack Brother    Social History   Socioeconomic History  . Marital status: Married    Spouse name: Not on file  . Number of children: Not on file  . Years of education: Not on file  . Highest education level: Not on file  Occupational History  . Not on file  Tobacco Use  . Smoking status: Never Smoker  . Smokeless tobacco: Never Used  Substance and Sexual Activity  . Alcohol use: No  . Drug use: No  . Sexual activity: Yes  Other Topics Concern  . Not on file  Social History Narrative   Married.   3 children.   Moved from [place].   Worked for [employer].   Enjoys hiking, spending time outdoors.    Social Determinants of Health   Financial Resource Strain:   . Difficulty of Paying Living Expenses:   Food Insecurity:   . Worried About Charity fundraiser in the Last Year:   . [place] in the Last  Year:   Transportation Needs:   . Film/video editor (Medical):   Marland Kitchen Lack of Transportation (Non-Medical):   Physical Activity:   . Days of Exercise per Week:   . Minutes of Exercise per Session:   Stress:   . Feeling of Stress :   Social Connections:   . Frequency of Communication with Friends and Family:   . Frequency of Social Gatherings with Friends and Family:   . Attends Religious Services:   . Active Member of Clubs or Organizations:   . Attends Archivist Meetings:   Marland Kitchen Marital Status:    Tobacco Counseling Counseling given: Not Answered   Clinical Intake:  Pre-visit preparation completed: Yes        Diabetes: No  How often do you need to have someone help you when you read instructions, pamphlets, or  other written materials from your doctor or pharmacy?: 1 - Never  Interpreter Needed?: No     Activities of Daily Living In your present state of health, do you have any difficulty performing the following activities: 11/18/2019  Hearing? Y  Comment Hearing aids  Vision? N  Difficulty concentrating or making decisions? N  Walking or climbing stairs? N  Dressing or bathing? N  Doing errands, shopping? N  Preparing Food and eating ? N  Using the Toilet? N  In the past six months, have you accidently leaked urine? N  Do you have problems with loss of bowel control? N  Managing your Medications? N  Managing your Finances? N  Housekeeping or managing your Housekeeping? N  Some recent data might be hidden     Immunizations and Health Maintenance Immunization History  Administered Date(s) Administered  . Fluad Quad(high Dose 65+) 04/09/2019  . Influenza,inj,Quad PF,6+ Mos 04/26/2018  . Influenza-Unspecified 05/07/2017  . PFIZER SARS-COV-2 Vaccination 08/13/2019, 09/03/2019  . Pneumococcal Conjugate-13 09/24/2019  . Pneumococcal Polysaccharide-23 08/07/2013  . Td 08/07/2013   There are no preventive care reminders to display for this patient.  Patient Care Team: Burnard Hawthorne, FNP as PCP - General (Family Medicine)  Indicate any recent Medical Services you may have received from other than [facility] providers in the past year (date may be approximate).    Assessment:   This is a routine wellness examination for Julian Ellis.  Nurse connected with patient 11/18/19 at 10:00 AM EDT by a telephone enabled telemedicine application and verified that I am speaking with the correct person using two identifiers. Patient stated full name and DOB. Patient gave permission to continue with virtual visit. Patient's location was at home and Nurse's location was at [place] office.   Patient is alert and oriented x3. Patient denies difficulty focusing or concentrating. Patient likes to play brain  challenging exercises on the computer for brain health.   Health Maintenance Due: See completed HM at the end of note.   Eye: Visual acuity not assessed. Virtual visit. Followed by their ophthalmologist. Glaucoma; drops in use.   Dental: Visits every 12 months.    Hearing: Hearing aids- yes  Safety:  Patient feels safe at home- yes Patient does have smoke detectors at home- yes Patient does wear sunscreen or protective clothing when in direct sunlight - yes Patient does wear seat belt when in a moving vehicle - yes Patient drives- yes Adequate lighting in walkways free from debris- yes Grab bars and handrails used as appropriate- yes Ambulates with an assistive device- no Cell phone on person when ambulating outside of the home- yes  Social: Alcohol intake - no     Smoking history- never   Smokers in home? none Illicit drug use? none  Medication: Taking as directed and without issues.  Pill box in use -yes  Self managed - yes   Covid-19: Precautions and sickness symptoms discussed. Wears mask, social distancing, hand hygiene as appropriate.   Activities of Daily Living Patient denies needing assistance with: household chores, feeding themselves, getting from bed to chair, getting to the toilet, bathing/showering, dressing, managing money, or preparing meals.   Discussed the importance of a healthy diet, water intake and the benefits of aerobic exercise.  Physical activity- walking  Diet:  Heart healthy Water: good intake Caffeine: none  Other Providers Patient Care Team: Burnard Hawthorne, FNP as PCP - General (Family Medicine)  Hearing/Vision screen  Hearing Screening   125Hz  250Hz  500Hz  1000Hz  2000Hz  3000Hz  4000Hz  6000Hz  8000Hz   Right ear:           Left ear:           Comments: Hearing aid, bilateral   Vision Screening Comments: Wears corrective lenses Visual acuity not assessed, virtual visit.  They have seen their ophthalmologist in the last 12  months.     Dietary issues and exercise activities discussed: Current Exercise Habits: Home exercise routine, Type of exercise: walking, Intensity: Mild  Goals      Patient Stated   . I want to walk more for exercise (pt-stated)      Depression Screen PHQ 2/9 Scores 11/18/2019 09/08/2019  PHQ - 2 Score 0 0    Fall Risk Fall Risk  11/18/2019 09/08/2019  Falls in the past year? 0 0  Follow up Falls evaluation completed Falls evaluation completed    Timed Get Up and Go performed: no, virtual visit  Cognitive Function:     6CIT Screen 11/18/2019  What Year? 0 points  What month? 0 points  What time? 0 points  Count back from 20 0 points  Months in reverse 0 points  Repeat phrase 0 points  Total Score 0    Screening Tests Health Maintenance  Topic Date Due  . INFLUENZA VACCINE  03/07/2020  . TETANUS/TDAP  08/08/2023  . PNA vac Low Risk Adult  Completed      Plan:   Keep all routine maintenance appointments.   Follow up 12/08/19 @ 1:30  Medicare Attestation I have personally reviewed: The patient's medical and social history Their use of alcohol, tobacco or illicit drugs Their current medications and supplements The patient's functional ability including ADLs,fall risks, home safety risks, cognitive, and hearing and visual impairment Diet and physical activities Evidence for depression   I have reviewed and discussed with patient certain preventive protocols, quality metrics, and best practice recommendations.      Varney Biles, LPN   [id number]     Agree with plan. Mable Paris, NP

## 2019-11-18 NOTE — Patient Instructions (Addendum)
  Julian Ellis , Thank you for taking time to come for your Medicare Wellness Visit. I appreciate your ongoing commitment to your health goals. Please review the following plan we discussed and let me know if I can assist you in the future.   These are the goals we discussed: Goals      Patient Stated   . I want to walk more for exercise (pt-stated)       This is a list of the screening recommended for you and due dates:  Health Maintenance  Topic Date Due  . Flu Shot  03/07/2020  . Tetanus Vaccine  08/08/2023  . Pneumonia vaccines  Completed

## 2019-12-08 ENCOUNTER — Ambulatory Visit (INDEPENDENT_AMBULATORY_CARE_PROVIDER_SITE_OTHER): Payer: Medicare Other | Admitting: Family

## 2019-12-08 ENCOUNTER — Other Ambulatory Visit: Payer: Self-pay

## 2019-12-08 ENCOUNTER — Encounter: Payer: Self-pay | Admitting: Family

## 2019-12-08 DIAGNOSIS — N400 Enlarged prostate without lower urinary tract symptoms: Secondary | ICD-10-CM | POA: Diagnosis not present

## 2019-12-08 DIAGNOSIS — E785 Hyperlipidemia, unspecified: Secondary | ICD-10-CM | POA: Diagnosis not present

## 2019-12-08 NOTE — Assessment & Plan Note (Signed)
Tolerating medication, will continue

## 2019-12-08 NOTE — Patient Instructions (Signed)
Nice to see you!   

## 2019-12-08 NOTE — Progress Notes (Signed)
Subjective:    Patient ID: Julian Ellis, male    DOB: Dec 07, 1935, 84 y.o.   MRN: [id number]  CC: [name] is a 84 y.o. male who presents today for follow up.   HPI: Feels well today, no complaints.  HLD- feels well on zocor.   H/o BPH- on flomax. No problem urinating.  States gets up once a night to urinate which is normal for him for some time.  No h/o prostate mass.   H/o tia- on asa 81mg    HISTORY:  Past Medical History:  Diagnosis Date  . Blood in stool   . BPH (benign prostatic hyperplasia)   . Hyperlipidemia   . Seasonal allergies   . TIA (transient ischemic attack)   . Urine incontinence    Past Surgical History:  Procedure Laterality Date  . BACK SURGERY  2010  . NECK SURGERY  2018  . TONSILLECTOMY  1964   Family History  Problem Relation Age of Onset  . Hyperlipidemia Mother   . Hypertension Mother   . Heart disease Father   . Heart attack Father   . Heart attack Sister   . Stroke Sister   . Alcohol abuse Brother   . Stroke Brother   . Early death Sister   . Heart disease Brother   . Heart attack Brother     Allergies: Compazine [prochlorperazine], Gluten meal, Sulfamethoxazole, and Wheat bran Current Outpatient Medications on File Prior to Visit  Medication Sig Dispense Refill  . aspirin EC 81 MG tablet Take 81 mg by mouth daily.    . fluticasone (FLONASE) 50 MCG/ACT nasal spray Place 2 sprays into both nostrils daily. 18.2 mL 10  . latanoprost (XALATAN) 0.005 % ophthalmic solution Place 1 drop into both eyes at bedtime.    . Multiple Vitamin (MULTIVITAMIN) tablet Take 1 tablet by mouth daily.    . sildenafil (REVATIO) 20 MG tablet Take 2 to 5 tablets by mouth 30 minutes prior to intercourse as needed. 50 tablet 0  . simvastatin (ZOCOR) 20 MG tablet Take 1 tablet (20 mg total) by mouth daily. 90 tablet 1  . tamsulosin (FLOMAX) 0.4 MG CAPS capsule TAKE 1 CAPSULE BY MOUTH  DAILY 90 capsule 0  . timolol (BETIMOL) 0.5 % ophthalmic  solution Place 1 drop into both eyes daily.     No current facility-administered medications on file prior to visit.    Social History   Tobacco Use  . Smoking status: Never Smoker  . Smokeless tobacco: Never Used  Substance Use Topics  . Alcohol use: No  . Drug use: No    Review of Systems  Constitutional: Negative for chills and fever.  Respiratory: Negative for cough.   Cardiovascular: Negative for chest pain and palpitations.  Gastrointestinal: Negative for nausea and vomiting.  Genitourinary: Negative for difficulty urinating, frequency, hematuria and urgency.      Objective:    BP 102/62   Pulse 65   Temp (!) 97.3 F (36.3 C) (Temporal)   Ht 6' (1.829 m)   Wt 187 lb 9.6 oz (85.1 kg)   SpO2 96%   BMI 25.44 kg/m  BP Readings from Last 3 Encounters:  12/08/19 102/62  02/06/19 118/70  01/01/19 122/76   Wt Readings from Last 3 Encounters:  12/08/19 187 lb 9.6 oz (85.1 kg)  11/18/19 185 lb (83.9 kg)  09/08/19 185 lb (83.9 kg)    Physical Exam Vitals reviewed.  Constitutional:      Appearance: He is  well-developed.  Cardiovascular:     Rate and Rhythm: Regular rhythm.     Heart sounds: Normal heart sounds.  Pulmonary:     Effort: Pulmonary effort is normal. No respiratory distress.     Breath sounds: Normal breath sounds. No wheezing, rhonchi or rales.  Skin:    General: Skin is warm and dry.  Neurological:     Mental Status: He is alert.  Psychiatric:        Speech: Speech normal.        Behavior: Behavior normal.        Assessment & Plan:   Problem List Items Addressed This Visit      Genitourinary   BPH (benign prostatic hyperplasia)    Symptoms at baseline.  I advised consult with urology for further evaluation, however patient quite declines he states he has done this in the past in [place].  Symptoms are unchanged he is doing well on Flomax as needed for revatio.  Emphasized the importance of annual PSA        Other   Hyperlipidemia     Tolerating medication, will continue          I am having Ed L. Cardozo "Bill" maintain his latanoprost, timolol, multivitamin, fluticasone, sildenafil, tamsulosin, aspirin EC, and simvastatin.   No orders of the defined types were placed in this encounter.   Return precautions given.   Risks, benefits, and alternatives of the medications and treatment plan prescribed today were discussed, and patient expressed understanding.   Education regarding symptom management and diagnosis given to patient on AVS.  Continue to follow with Burnard Hawthorne, FNP for routine health maintenance.   Floy Tenna Child and I agreed with plan.   Mable Paris, FNP

## 2019-12-08 NOTE — Assessment & Plan Note (Signed)
Symptoms at baseline.  I advised consult with urology for further evaluation, however patient quite declines he states he has done this in the past in [place].  Symptoms are unchanged he is doing well on Flomax as needed for revatio.  Emphasized the importance of annual PSA

## 2020-01-12 ENCOUNTER — Telehealth: Payer: Self-pay | Admitting: Family

## 2020-01-12 DIAGNOSIS — N400 Enlarged prostate without lower urinary tract symptoms: Secondary | ICD-10-CM

## 2020-01-12 MED ORDER — TAMSULOSIN HCL 0.4 MG PO CAPS: 0.4000 mg | ORAL_CAPSULE | Freq: Every day | ORAL | 0 refills | Status: DC

## 2020-01-12 NOTE — Telephone Encounter (Signed)
Pt needs a refill on tamsulosin (FLOMAX) 0.4 MG CAPS capsule sent to [facility]

## 2020-03-12 IMAGING — US US CAROTID DUPLEX BILAT
1 series · 13 of 24 positions shown · non-contrast
Comparison: None.

CLINICAL DATA: Hypertension, TIA symptoms, hyperlipidemia

EXAM:
BILATERAL CAROTID DUPLEX ULTRASOUND
TECHNIQUE: Gray scale imaging, color Doppler and duplex ultrasound were
performed of bilateral carotid and vertebral arteries in the neck.

[Series 1: us carotid bilateral · 13 of 68 slices shown]
[im 1/68]
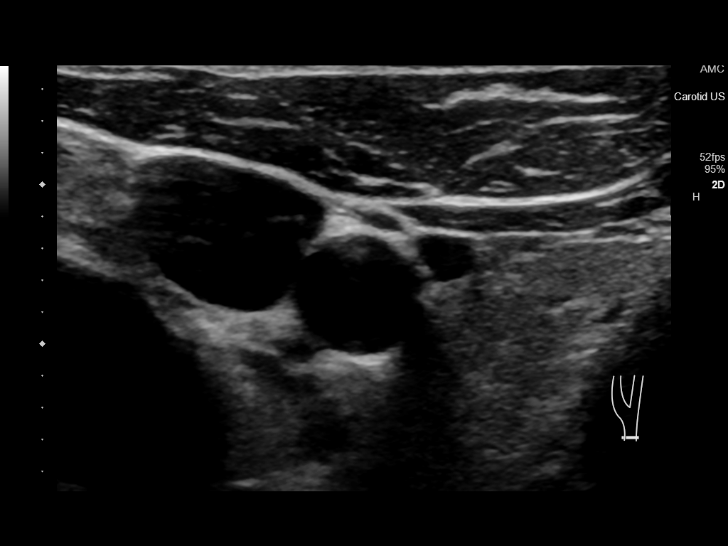
[im 6/68]
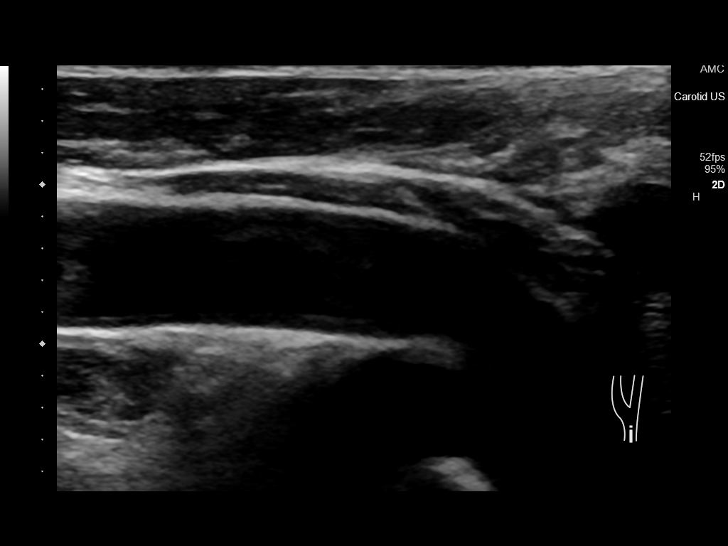
[im 12/68]
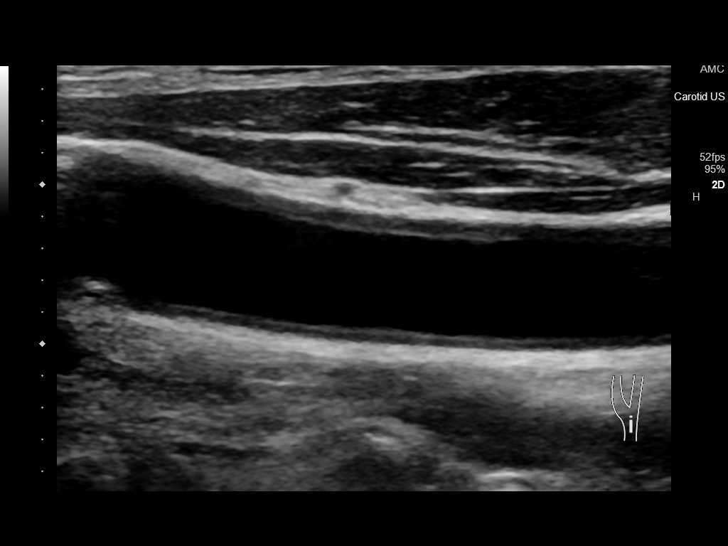
[im 18/68]
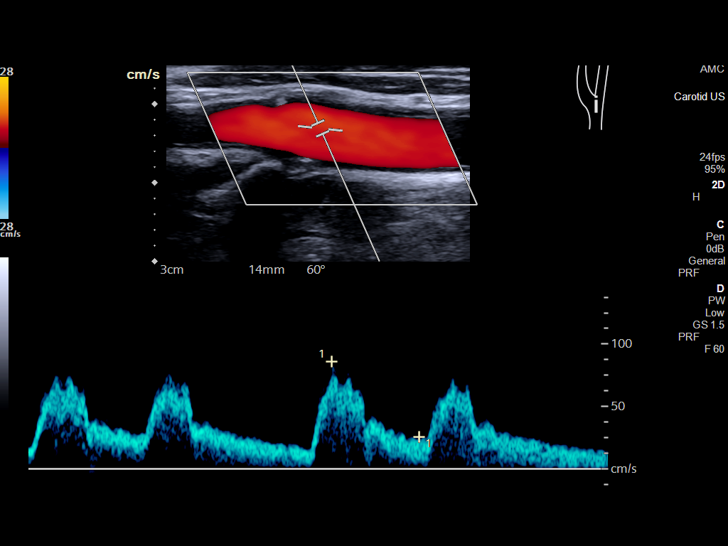
[im 24/68]
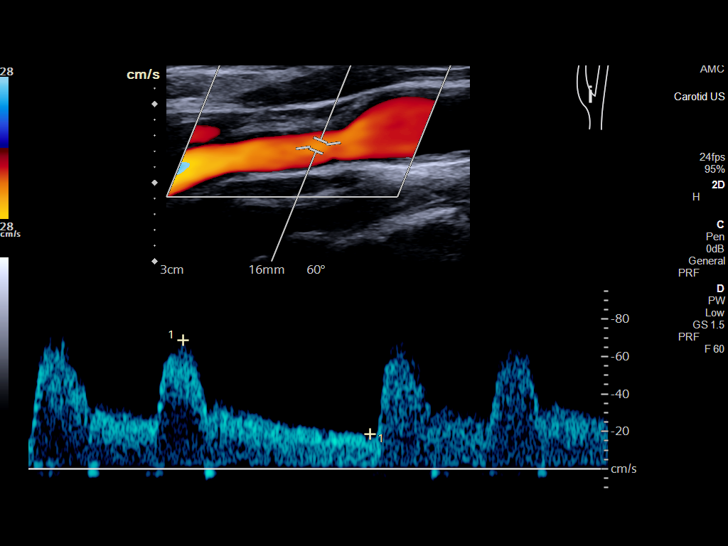
[im 30/68]
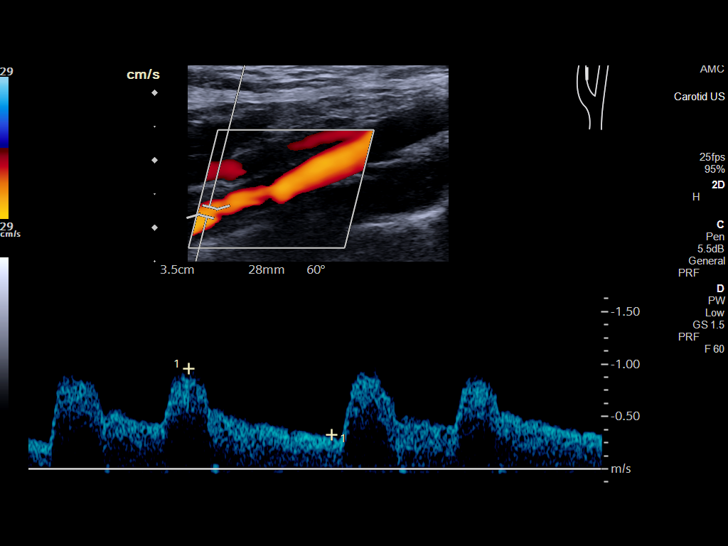
[im 35/68]
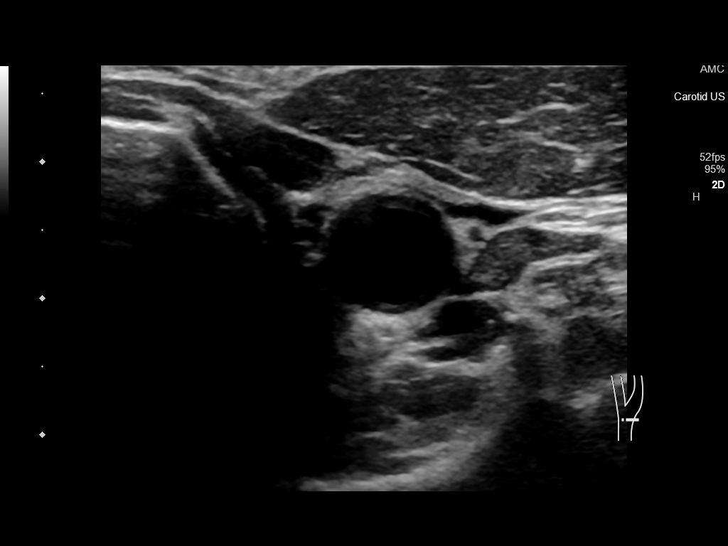
[im 38/68]
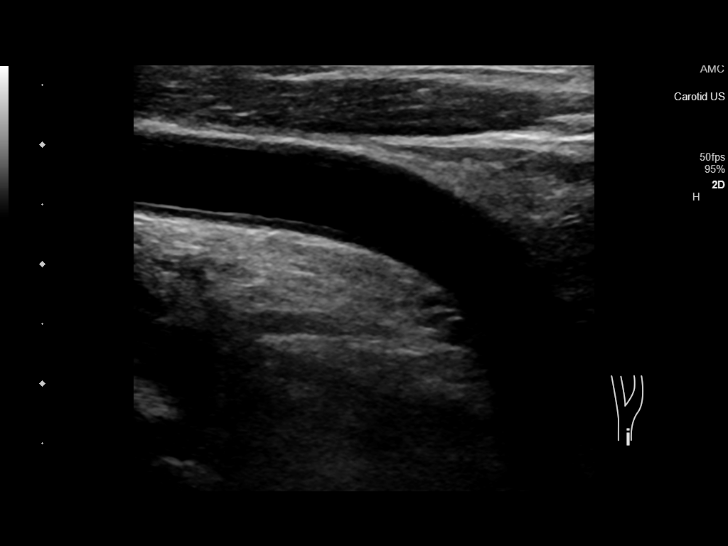
[im 44/68]
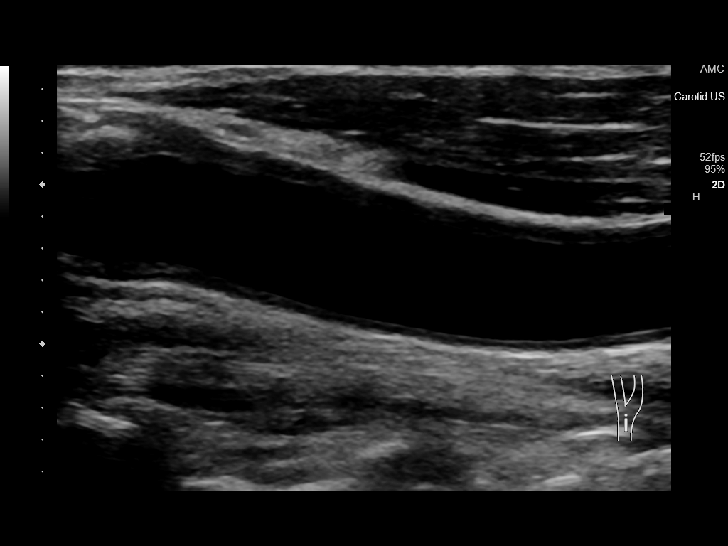
[im 50/68]
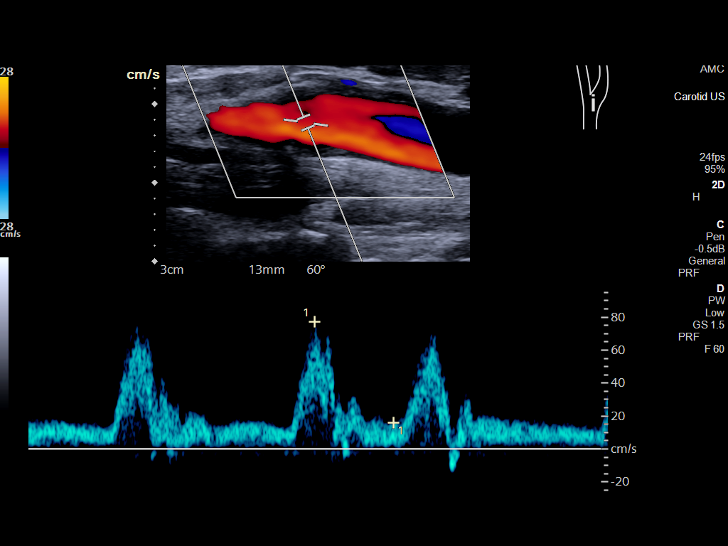
[im 56/68]
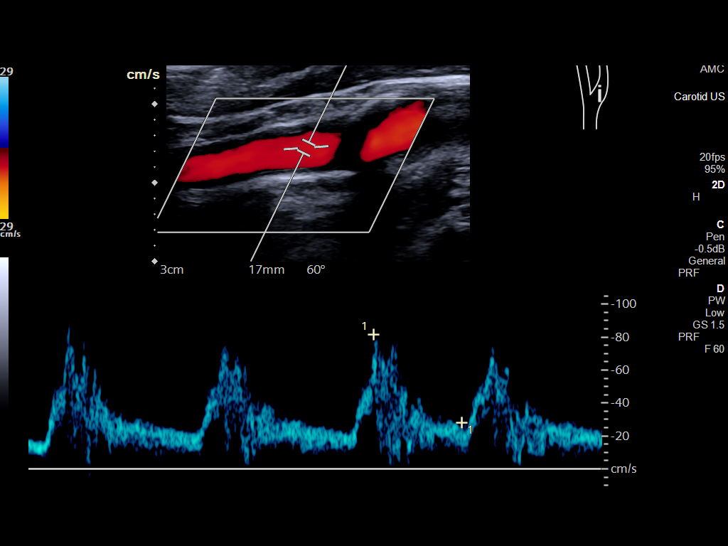
[im 62/68]
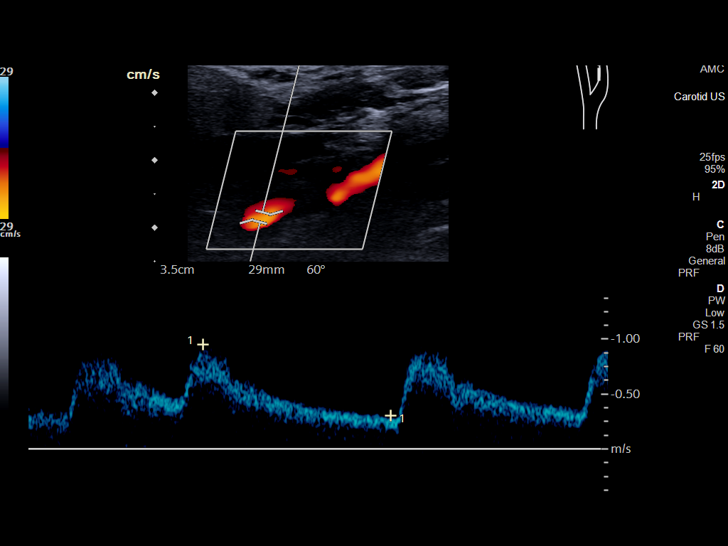
[im 68/68]
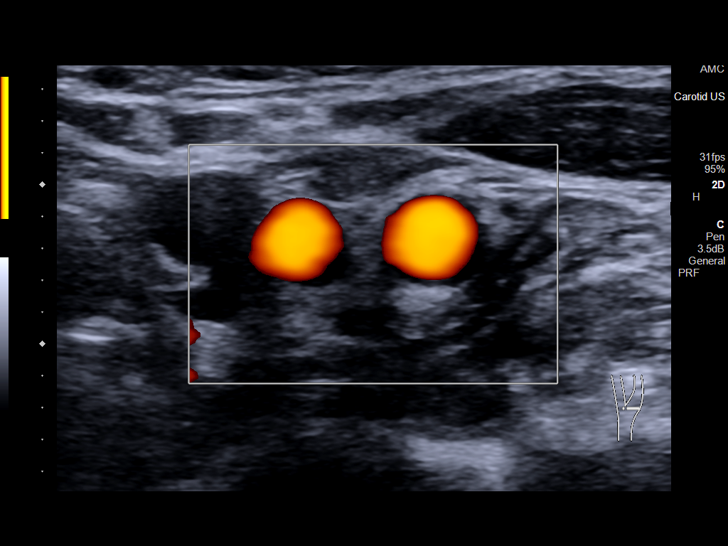

[13 of 24 positions shown; findings below may reference images not displayed]

FINDINGS: Criteria: Quantification of carotid stenosis is based on velocity
parameters that correlate the residual internal carotid diameter
with NASCET-based stenosis levels, using the diameter of the distal
internal carotid lumen as the denominator for stenosis measurement.

The following velocity measurements were obtained:

RIGHT

ICA: 97/33 cm/sec

CCA: 91/23 cm/sec

SYSTOLIC ICA/CCA RATIO:

ECA: 120 cm/sec

LEFT

ICA: 95/30 cm/sec

CCA: 110/27 cm/sec

SYSTOLIC ICA/CCA RATIO:

ECA: 112 cm/sec

RIGHT CAROTID ARTERY: Minor echogenic shadowing plaque formation. No
hemodynamically significant right ICA stenosis, velocity elevation,
or turbulent flow. Degree of narrowing less than 50%.

RIGHT VERTEBRAL ARTERY:  Antegrade

LEFT CAROTID ARTERY: Similar scattered minor echogenic plaque
formation. No hemodynamically significant left ICA stenosis,
velocity elevation, or turbulent flow.

LEFT VERTEBRAL ARTERY:  Antegrade
IMPRESSION: Minor carotid atherosclerosis. No hemodynamically significant ICA
stenosis. Degree of narrowing less than 50% bilaterally by
ultrasound criteria.

Patent antegrade vertebral flow bilaterally

## 2020-03-14 ENCOUNTER — Other Ambulatory Visit: Payer: Self-pay | Admitting: Family

## 2020-03-14 DIAGNOSIS — N400 Enlarged prostate without lower urinary tract symptoms: Secondary | ICD-10-CM

## 2020-11-18 ENCOUNTER — Ambulatory Visit: Payer: Medicare Other
